# Patient Record
Sex: Male | Born: 1980 | Race: White | Hispanic: No | Marital: Married | Smoking: Current every day smoker
Health system: Southern US, Community
[De-identification: ages and names within clinical notes are randomized; demographics above are authoritative.]

## PROBLEM LIST (undated history)

## (undated) DIAGNOSIS — F419 Anxiety disorder, unspecified: Secondary | ICD-10-CM

## (undated) DIAGNOSIS — F319 Bipolar disorder, unspecified: Secondary | ICD-10-CM

## (undated) HISTORY — PX: NECK SURGERY: SHX720

---

## 2006-02-14 HISTORY — PX: EYE SURGERY: SHX253

## 2010-05-13 ENCOUNTER — Emergency Department (HOSPITAL_COMMUNITY)

## 2010-05-13 ENCOUNTER — Emergency Department (HOSPITAL_COMMUNITY)
Admission: EM | Admit: 2010-05-13 | Discharge: 2010-05-13 | Attending: Emergency Medicine | Admitting: Emergency Medicine

## 2010-05-13 DIAGNOSIS — S01501A Unspecified open wound of lip, initial encounter: Secondary | ICD-10-CM | POA: Insufficient documentation

## 2010-05-13 DIAGNOSIS — Y929 Unspecified place or not applicable: Secondary | ICD-10-CM | POA: Insufficient documentation

## 2010-05-13 DIAGNOSIS — S0003XA Contusion of scalp, initial encounter: Secondary | ICD-10-CM | POA: Insufficient documentation

## 2015-04-08 ENCOUNTER — Emergency Department (HOSPITAL_COMMUNITY)
Admission: EM | Admit: 2015-04-08 | Discharge: 2015-04-08 | Disposition: A | Discharge status: Home / Self Care | Attending: Emergency Medicine | Admitting: Emergency Medicine

## 2015-04-08 ENCOUNTER — Encounter (HOSPITAL_COMMUNITY): Payer: Self-pay | Admitting: *Deleted

## 2015-04-08 DIAGNOSIS — R197 Diarrhea, unspecified: Secondary | ICD-10-CM | POA: Insufficient documentation

## 2015-04-08 DIAGNOSIS — F17200 Nicotine dependence, unspecified, uncomplicated: Secondary | ICD-10-CM | POA: Insufficient documentation

## 2015-04-08 DIAGNOSIS — R6883 Chills (without fever): Secondary | ICD-10-CM | POA: Insufficient documentation

## 2015-04-08 DIAGNOSIS — R109 Unspecified abdominal pain: Secondary | ICD-10-CM | POA: Insufficient documentation

## 2015-04-08 DIAGNOSIS — R61 Generalized hyperhidrosis: Secondary | ICD-10-CM | POA: Insufficient documentation

## 2015-04-08 DIAGNOSIS — R1110 Vomiting, unspecified: Secondary | ICD-10-CM | POA: Insufficient documentation

## 2015-04-08 LAB — CBC
HCT: 49.6 % (ref 39.0–52.0)
Hemoglobin: 17.2 g/dL — ABNORMAL HIGH (ref 13.0–17.0)
MCH: 35.5 pg — ABNORMAL HIGH (ref 26.0–34.0)
MCHC: 34.7 g/dL (ref 30.0–36.0)
MCV: 102.5 fL — ABNORMAL HIGH (ref 78.0–100.0)
PLATELETS: 204 10*3/uL (ref 150–400)
RBC: 4.84 MIL/uL (ref 4.22–5.81)
RDW: 12.3 % (ref 11.5–15.5)
WBC: 9.7 10*3/uL (ref 4.0–10.5)

## 2015-04-08 LAB — COMPREHENSIVE METABOLIC PANEL
ALK PHOS: 68 U/L (ref 38–126)
ALT: 33 U/L (ref 17–63)
AST: 22 U/L (ref 15–41)
Albumin: 4.4 g/dL (ref 3.5–5.0)
Anion gap: 9 (ref 5–15)
BILIRUBIN TOTAL: 1.1 mg/dL (ref 0.3–1.2)
BUN: 15 mg/dL (ref 6–20)
CALCIUM: 9.6 mg/dL (ref 8.9–10.3)
CHLORIDE: 107 mmol/L (ref 101–111)
CO2: 26 mmol/L (ref 22–32)
CREATININE: 0.8 mg/dL (ref 0.61–1.24)
Glucose, Bld: 123 mg/dL — ABNORMAL HIGH (ref 65–99)
Potassium: 4.3 mmol/L (ref 3.5–5.1)
Sodium: 142 mmol/L (ref 135–145)
TOTAL PROTEIN: 8 g/dL (ref 6.5–8.1)

## 2015-04-08 LAB — LIPASE, BLOOD: LIPASE: 26 U/L (ref 11–51)

## 2015-04-08 MED ORDER — PROMETHAZINE HCL 25 MG PO TABS: 25.0000 mg | ORAL_TABLET | Freq: Four times a day (QID) | ORAL | Status: DC | PRN

## 2015-04-08 MED ORDER — ONDANSETRON HCL 4 MG/2ML IJ SOLN
4.0000 mg | Freq: Once | INTRAMUSCULAR | Status: AC | PRN
  Administered 2015-04-08: 4 mg via INTRAVENOUS
  Filled 2015-04-08: qty 2

## 2015-04-08 MED ORDER — ONDANSETRON HCL 4 MG/2ML IJ SOLN: 4.0000 mg | Freq: Once | INTRAMUSCULAR | Status: DC

## 2015-04-08 MED ORDER — SODIUM CHLORIDE 0.9 % IV BOLUS (SEPSIS)
1000.0000 mL | Freq: Once | INTRAVENOUS | Status: AC
  Administered 2015-04-08: 1000 mL via INTRAVENOUS

## 2015-04-08 NOTE — ED Notes (Signed)
Declined W/C at D/C and was escorted to lobby by RN. 

## 2015-04-08 NOTE — ED Notes (Signed)
Pt reports at 0200 vomiting x2 and has been unable to keep water down . Pt does not  Have a temp.

## 2015-04-08 NOTE — Discharge Instructions (Signed)

## 2015-04-08 NOTE — ED Provider Notes (Signed)
CSN: [id number]     Arrival date & time 04/08/15  0865 History   First MD Initiated Contact with Patient 04/08/15 [phone]     Chief Complaint  Patient presents with  . Emesis  . Nausea  . Abdominal Pain     (Consider location/radiation/quality/duration/timing/severity/associated sxs/prior Treatment) HPI Comments: The patient is a 35 year old male, he is otherwise healthy, he works as an Company secretary, he reports that over the last couple of days he has had some unusual night sweats and chills, he reports that in the last 6 hours he has developed several episodes of vomiting with multiple episodes of watery diarrhea. He has been exposed to other people at his homeless shelter where they have the flu. He denies taking any medications, he has been able to hold down only a very small amount of fluid overnight but then vomited again this morning. Symptoms are persistent, nothing makes this better or worse, his significant other in the room with him has not had similar symptoms  Patient is a 35 y.o. male presenting with vomiting and abdominal pain. The history is provided by the patient.  Emesis Associated symptoms: abdominal pain   Abdominal Pain Associated symptoms: vomiting     History reviewed. No pertinent past medical history. Past Surgical History  Procedure Laterality Date  . Eye surgery Bilateral 2008   History reviewed. No pertinent family history. Social History  Substance Use Topics  . Smoking status: Current Every Day Smoker -- 0.50 packs/day  . Smokeless tobacco: Never Used  . Alcohol Use: 0.6 oz/week    1 Cans of beer per week     Comment: social    Review of Systems  Gastrointestinal: Positive for vomiting and abdominal pain.  All other systems reviewed and are negative.     Allergies  Review of patient's allergies indicates no known allergies.  Home Medications   Prior to Admission medications   Medication Sig Start Date End Date Taking?  Authorizing Provider  promethazine (PHENERGAN) 25 MG tablet Take 1 tablet (25 mg total) by mouth every 6 (six) hours as needed for nausea or vomiting. 04/08/15   Eber Hong, MD   BP 116/85 mmHg  Pulse 85  Temp(Src) 97.6 F (36.4 C) (Oral)  Resp 18  Ht  (1.803 m)  Wt 141 lb (63.957 kg)  BMI 19.67 kg/m2  SpO2 99% Physical Exam  Constitutional: He appears well-developed and well-nourished. No distress.  HENT:  Head: Normocephalic and atraumatic.  Mouth/Throat: Oropharynx is clear and moist. No oropharyngeal exudate.  Eyes: Conjunctivae and EOM are normal. Pupils are equal, round, and reactive to light. Right eye exhibits no discharge. Left eye exhibits no discharge. No scleral icterus.  Neck: Normal range of motion. Neck supple. No JVD present. No thyromegaly present.  Cardiovascular: Normal rate, regular rhythm, normal heart sounds and intact distal pulses.  Exam reveals no gallop and no friction rub.   No murmur heard. Pulmonary/Chest: Effort normal and breath sounds normal. No respiratory distress. He has no wheezes. He has no rales.  Abdominal: Soft. Bowel sounds are normal. He exhibits no distension and no mass. There is no tenderness.  Musculoskeletal: Normal range of motion. He exhibits no edema or tenderness.  Lymphadenopathy:    He has no cervical adenopathy.  Neurological: He is alert. Coordination normal.  Skin: Skin is warm and dry. No rash noted. No erythema.  Psychiatric: He has a normal mood and affect. His behavior is normal.  Nursing note and vitals reviewed.  ED Course  Procedures (including critical care time) Labs Review Labs Reviewed  COMPREHENSIVE METABOLIC PANEL - Abnormal; Notable for the following:    Glucose, Bld 123 (*)    All other components within normal limits  CBC - Abnormal; Notable for the following:    Hemoglobin 17.2 (*)    MCV 102.5 (*)    MCH 35.5 (*)    All other components within normal limits  LIPASE, BLOOD    Imaging  Review No results found. I have personally reviewed and evaluated these images and lab results as part of my medical decision-making.    MDM   Final diagnoses:  Vomiting and diarrhea    The patient has a normal cardiac and pulmonary exam, his abdomen is soft, there is no increased bowel sounds, he has no tenderness, I suspect that he has some kind of gastrointestinal illness which could include a flulike illness, gastroenteritis, food poisoning from undercooked meat loaf which she ate last night, nonetheless the treatment plan is the same with IV fluids, antiemetics and home with medications to help him continue to fluid oral rehydrate. He is in agreement with the plan. He is stable appearing, I anticipate discharge once the splints have been given. He has expressed his understanding to the verbal discharge instructions.  Fluids given, Phenergan for home VS normal Labs reassuring  Meds given in ED:  Medications  ondansetron (ZOFRAN) injection 4 mg (not administered)  ondansetron (ZOFRAN) injection 4 mg (4 mg Intravenous Given 04/08/15 0942)  sodium chloride 0.9 % bolus 1,000 mL (1,000 mLs Intravenous New Bag/Given 04/08/15 1037)    New Prescriptions   PROMETHAZINE (PHENERGAN) 25 MG TABLET    Take 1 tablet (25 mg total) by mouth every 6 (six) hours as needed for nausea or vomiting.        Eber Hong, MD 04/08/15 1056

## 2016-06-09 ENCOUNTER — Emergency Department (HOSPITAL_COMMUNITY)
Admission: EM | Admit: 2016-06-09 | Discharge: 2016-06-10 | Disposition: A | Discharge status: Home / Self Care | Payer: Self-pay

## 2016-06-09 ENCOUNTER — Encounter (HOSPITAL_COMMUNITY): Payer: Self-pay | Admitting: Emergency Medicine

## 2016-06-09 DIAGNOSIS — F17210 Nicotine dependence, cigarettes, uncomplicated: Secondary | ICD-10-CM | POA: Insufficient documentation

## 2016-06-09 DIAGNOSIS — F1414 Cocaine abuse with cocaine-induced mood disorder: Secondary | ICD-10-CM | POA: Insufficient documentation

## 2016-06-09 DIAGNOSIS — R45851 Suicidal ideations: Secondary | ICD-10-CM

## 2016-06-09 DIAGNOSIS — F1210 Cannabis abuse, uncomplicated: Secondary | ICD-10-CM | POA: Insufficient documentation

## 2016-06-09 DIAGNOSIS — F1910 Other psychoactive substance abuse, uncomplicated: Secondary | ICD-10-CM

## 2016-06-09 HISTORY — DX: Bipolar disorder, unspecified: F31.9

## 2016-06-09 HISTORY — DX: Anxiety disorder, unspecified: F41.9

## 2016-06-09 LAB — COMPREHENSIVE METABOLIC PANEL
ALT: 84 U/L — ABNORMAL HIGH (ref 17–63)
AST: 56 U/L — ABNORMAL HIGH (ref 15–41)
Albumin: 4.3 g/dL (ref 3.5–5.0)
Alkaline Phosphatase: 62 U/L (ref 38–126)
Anion gap: 8 (ref 5–15)
BUN: 11 mg/dL (ref 6–20)
CO2: 26 mmol/L (ref 22–32)
Calcium: 9 mg/dL (ref 8.9–10.3)
Chloride: 105 mmol/L (ref 101–111)
Creatinine, Ser: 0.7 mg/dL (ref 0.61–1.24)
GFR calc Af Amer: >60 mL/min (ref >60)
GFR calc non Af Amer: >60 mL/min (ref >60)
Glucose, Bld: 94 mg/dL (ref 65–99)
Potassium: 3.7 mmol/L (ref 3.5–5.1)
Sodium: 139 mmol/L (ref 135–145)
Total Bilirubin: 0.5 mg/dL (ref 0.3–1.2)
Total Protein: 7.4 g/dL (ref 6.5–8.1)

## 2016-06-09 LAB — CBC
HCT: 41.8 % (ref 39.0–52.0)
Hemoglobin: 14.7 g/dL (ref 13.0–17.0)
MCH: 35 pg — ABNORMAL HIGH (ref 26.0–34.0)
MCHC: 35.2 g/dL (ref 30.0–36.0)
MCV: 99.5 fL (ref 78.0–100.0)
Platelets: 194 10*3/uL (ref 150–400)
RBC: 4.2 MIL/uL — ABNORMAL LOW (ref 4.22–5.81)
RDW: 12.9 % (ref 11.5–15.5)
WBC: 5.1 10*3/uL (ref 4.0–10.5)

## 2016-06-09 LAB — ACETAMINOPHEN LEVEL: Acetaminophen (Tylenol), Serum: <10 ug/mL — ABNORMAL LOW (ref 10–30)

## 2016-06-09 LAB — RAPID URINE DRUG SCREEN, HOSP PERFORMED
Amphetamines: NOT DETECTED
Barbiturates: NOT DETECTED
Benzodiazepines: NOT DETECTED
Cocaine: POSITIVE — AB
Opiates: NOT DETECTED
Tetrahydrocannabinol: POSITIVE — AB

## 2016-06-09 LAB — SALICYLATE LEVEL: Salicylate Lvl: <7 mg/dL (ref 2.8–30.0)

## 2016-06-09 LAB — ETHANOL: Alcohol, Ethyl (B): <5 mg/dL (ref <5)

## 2016-06-09 MED ORDER — IBUPROFEN 200 MG PO TABS: 600.0000 mg | ORAL_TABLET | Freq: Three times a day (TID) | ORAL | Status: DC | PRN

## 2016-06-09 MED ORDER — ONDANSETRON HCL 4 MG PO TABS: 4.0000 mg | ORAL_TABLET | Freq: Three times a day (TID) | ORAL | Status: DC | PRN
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
## 2016-06-09 NOTE — ED Triage Notes (Signed)
Pt c/o SI with plan to overdose on drugs. No HI/AVH. Using [place], cocaine, marijuana, drinking about 3 cases of beer and sometimes a fifth of vodka daily. Last drink last night. Drug habit is ruining his marriage, leading to SI.

## 2016-06-09 NOTE — ED Notes (Signed)
Patient and family became verbally aggressive towards staff. Patient's states that "Earlier on the shift they treated me like shit". "They was rude to me and when I said I was gone kill myself they walked of shrug their shoulder". Patient states that "so since they gone let me go in the morning might as well give me stuff and let me tonight so I can kill myself when I leave here". Writer explained to patient that he could not leave tonight that he would have to psychiatrist in the morning. Security was called to on unit to verbal aggression. Writer asked patient and their family member if they could step in room so that they could have a calm conversation without the yelling. Writer explained to patient and family member about the admission process and unit rules. Writer apologize for anything that may have been conceived to be disrespectful and uncaring. Patient family member is tearful and crying and states "they have treated him like shit all day and no one is concerned that he is suicidal". "I have had to stop him on three occasion from trying to kill himself". "I am afraid my mom just died two weeks ago from an over dose and I am scared it's going to happen to him". Writer entered into a therapeutic discussion with Clinical research associate and family member. Patient and their family member was apologetic for their outburst earlier. Patient calm and cooperative at this time. Encouragement and support provided and safety maintain. Snack and soda given to patient. Soda also given to patient's family member. Safety maintain and Q 15 min safety checks remain in place and video monitoring.
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
## 2016-06-09 NOTE — ED Notes (Signed)
Pt wife visiting with pt at bedside.
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
## 2016-06-09 NOTE — ED Notes (Signed)
Pt talking on hallway phone.  

## 2016-06-09 NOTE — ED Notes (Signed)
Patient reports SI with a plan to over dose on drugs. Patient denies HI and AVH at this time. Plan of care discussed. Encouragement and support provided and safety maintain. Q 15 min safety checks remain in place and video monitoring.

## 2016-06-09 NOTE — ED Notes (Signed)
Bed: [facility] Expected date:  Expected time:  Means of arrival:  Comments: 

## 2016-06-10 DIAGNOSIS — F17210 Nicotine dependence, cigarettes, uncomplicated: Secondary | ICD-10-CM

## 2016-06-10 DIAGNOSIS — F1290 Cannabis use, unspecified, uncomplicated: Secondary | ICD-10-CM

## 2016-06-10 DIAGNOSIS — F1990 Other psychoactive substance use, unspecified, uncomplicated: Secondary | ICD-10-CM

## 2016-06-10 DIAGNOSIS — F1414 Cocaine abuse with cocaine-induced mood disorder: Secondary | ICD-10-CM | POA: Diagnosis present

## 2016-06-10 NOTE — BH Assessment (Signed)
[facility] Assessment Progress Note  Per Thedore Mins, MD, this pt does not require psychiatric hospitalization at this time.  He is to be discharged from [facility].  He does not require outpatient referrals at this time.  Pt's nurse, Morrie Sheldon, has been notified.  Doylene Canning, MA Triage Specialist [phone]

## 2016-06-10 NOTE — ED Notes (Signed)
This nurse in pt room to introduce self and give  pt breakfast tray. Pt irritable and uninterested. Pt endorsing passive SI. Denies HI. Denies AVH. Encouragement and support provided. When asked if there is anything else that he needs at this time, pt reports "No, I'm alright." Informed pt that psychiatry team will be making rounds this morning, pt remains uninterested. Special checks q 15 mins in place for safety, Video monitoring in place. Will continue to monitor.
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
## 2016-06-10 NOTE — [facility] Suicide Risk Assessment (Signed)
Suicide Risk Assessment  Discharge Assessment   [facility] Discharge Suicide Risk Assessment   Principal Problem: Cocaine abuse with cocaine-induced mood disorder [facility]) Discharge Diagnoses:  Patient Active Problem List   Diagnosis Date Noted  . Cocaine abuse with cocaine-induced mood disorder [facility]) [F14.14] 06/10/2016    Priority: High    Total Time spent with patient: 45 minutes  Musculoskeletal: Strength & Muscle Tone: within normal limits Gait & Station: normal Patient leans: N/A  Psychiatric Specialty Exam: Physical Exam  Constitutional: He is oriented to person, place, and time. He appears well-developed and well-nourished.  HENT:  Head: Normocephalic.  Neck: Normal range of motion.  Respiratory: Effort normal.  Musculoskeletal: Normal range of motion.  Neurological: He is alert and oriented to person, place, and time.  Psychiatric: His speech is normal and behavior is normal. Judgment and thought content normal. His affect is angry. Cognition and memory are normal.    Review of Systems  Psychiatric/Behavioral: Positive for substance abuse.  All other systems reviewed and are negative.   Blood pressure 127/75, pulse 64, temperature 97.5 F (36.4 C), temperature source Oral, resp. rate 17, height  (1.803 m), weight 63.5 kg (140 lb), SpO2 99 %.Body mass index is 19.53 kg/m.  General Appearance: Casual  Eye Contact:  Good  Speech:  Normal Rate  Volume:  Normal  Mood:  Irritable  Affect:  Congruent  Thought Process:  Coherent and Descriptions of Associations: Intact  Orientation:  Full (Time, Place, and Person)  Thought Content:  WDL and Logical  Suicidal Thoughts:  No  Homicidal Thoughts:  No  Memory:  Immediate;   Good Recent;   Good Remote;   Good  Judgement:  Fair  Insight:  Fair  Psychomotor Activity:  Normal  Concentration:  Concentration: Good and Attention Span: Good  Recall:  Good  Fund of Knowledge:  Fair  Language:  Good  Akathisia:  No  Handed:   Right  AIMS (if indicated):     Assets:  Leisure Time Physical Health Resilience Social Support  ADL's:  Intact  Cognition:  WNL  Sleep:       Mental Status Per Nursing Assessment::   On Admission:   polysubstance abuse with suicidal ideations  Demographic Factors:  Male and Caucasian  Loss Factors: Legal issues  Historical Factors: NA  Risk Reduction Factors:   Sense of responsibility to family, Living with another person, especially a relative and Positive social support  Continued Clinical Symptoms:  Irritable  Cognitive Features That Contribute To Risk:  None    Suicide Risk:  Minimal: No identifiable suicidal ideation.  Patients presenting with no risk factors but with morbid ruminations; may be classified as minimal risk based on the severity of the depressive symptoms    Plan Of Care/Follow-up recommendations:  Activity:  as tolerated Diet:  heart healhty diet  Chrishonda Hesch, NP 06/10/2016, 10:25 AM
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
## 2016-06-10 NOTE — ED Notes (Signed)
Pt d/c home per MD order. Discharge summary reviewed with pt. Pt uninterested. Pt reports that he will be going outside to smoke a cigaerette and coming back to the ED. Jameson,NP made aware. Pt refuse to allow nurse tech take d/c vs.

## 2016-06-10 NOTE — ED Notes (Signed)
Pt demanding to leave, reports he would rather be in jail then in here. Jorene Minors, NP made aware.

## 2016-06-13 ENCOUNTER — Encounter (HOSPITAL_COMMUNITY): Payer: Self-pay | Admitting: *Deleted

## 2016-07-21 ENCOUNTER — Encounter (HOSPITAL_COMMUNITY): Payer: Self-pay | Admitting: Vascular Surgery

## 2016-07-21 ENCOUNTER — Emergency Department (HOSPITAL_COMMUNITY)
Admission: EM | Admit: 2016-07-21 | Discharge: 2016-07-21 | Disposition: A | Discharge status: Other Acute Inpatient Hospital | Attending: Emergency Medicine | Admitting: Emergency Medicine

## 2016-07-21 ENCOUNTER — Inpatient Hospital Stay (HOSPITAL_COMMUNITY)
Admission: AD | Admit: 2016-07-21 | Discharge: 2016-07-29 | DRG: 885 | Disposition: A | Discharge status: Home / Self Care | Payer: Federal, State, Local not specified - Other | Source: Intra-hospital | Attending: Psychiatry | Admitting: Psychiatry

## 2016-07-21 DIAGNOSIS — G4700 Insomnia, unspecified: Secondary | ICD-10-CM | POA: Diagnosis present

## 2016-07-21 DIAGNOSIS — F419 Anxiety disorder, unspecified: Secondary | ICD-10-CM | POA: Insufficient documentation

## 2016-07-21 DIAGNOSIS — Z630 Problems in relationship with spouse or partner: Secondary | ICD-10-CM | POA: Diagnosis not present

## 2016-07-21 DIAGNOSIS — R45851 Suicidal ideations: Secondary | ICD-10-CM | POA: Diagnosis present

## 2016-07-21 DIAGNOSIS — Z653 Problems related to other legal circumstances: Secondary | ICD-10-CM | POA: Diagnosis not present

## 2016-07-21 DIAGNOSIS — F1190 Opioid use, unspecified, uncomplicated: Secondary | ICD-10-CM | POA: Diagnosis not present

## 2016-07-21 DIAGNOSIS — Z915 Personal history of self-harm: Secondary | ICD-10-CM | POA: Diagnosis not present

## 2016-07-21 DIAGNOSIS — F39 Unspecified mood [affective] disorder: Secondary | ICD-10-CM | POA: Diagnosis not present

## 2016-07-21 DIAGNOSIS — R740 Nonspecific elevation of levels of transaminase and lactic acid dehydrogenase [LDH]: Secondary | ICD-10-CM | POA: Diagnosis present

## 2016-07-21 DIAGNOSIS — F1990 Other psychoactive substance use, unspecified, uncomplicated: Secondary | ICD-10-CM | POA: Diagnosis not present

## 2016-07-21 DIAGNOSIS — F1414 Cocaine abuse with cocaine-induced mood disorder: Secondary | ICD-10-CM | POA: Insufficient documentation

## 2016-07-21 DIAGNOSIS — Z590 Homelessness: Secondary | ICD-10-CM

## 2016-07-21 DIAGNOSIS — F411 Generalized anxiety disorder: Secondary | ICD-10-CM | POA: Diagnosis present

## 2016-07-21 DIAGNOSIS — F17210 Nicotine dependence, cigarettes, uncomplicated: Secondary | ICD-10-CM | POA: Diagnosis present

## 2016-07-21 DIAGNOSIS — F1290 Cannabis use, unspecified, uncomplicated: Secondary | ICD-10-CM | POA: Diagnosis not present

## 2016-07-21 DIAGNOSIS — F319 Bipolar disorder, unspecified: Principal | ICD-10-CM | POA: Diagnosis present

## 2016-07-21 DIAGNOSIS — Z560 Unemployment, unspecified: Secondary | ICD-10-CM

## 2016-07-21 DIAGNOSIS — Z62810 Personal history of physical and sexual abuse in childhood: Secondary | ICD-10-CM | POA: Diagnosis present

## 2016-07-21 DIAGNOSIS — F1490 Cocaine use, unspecified, uncomplicated: Secondary | ICD-10-CM | POA: Diagnosis not present

## 2016-07-21 LAB — COMPREHENSIVE METABOLIC PANEL
ALBUMIN: 4.2 g/dL (ref 3.5–5.0)
ALK PHOS: 59 U/L (ref 38–126)
ALT: 102 U/L — ABNORMAL HIGH (ref 17–63)
ANION GAP: 9 (ref 5–15)
AST: 55 U/L — ABNORMAL HIGH (ref 15–41)
BILIRUBIN TOTAL: 1.1 mg/dL (ref 0.3–1.2)
BUN: 6 mg/dL (ref 6–20)
CALCIUM: 9.3 mg/dL (ref 8.9–10.3)
CO2: 25 mmol/L (ref 22–32)
Chloride: 105 mmol/L (ref 101–111)
Creatinine, Ser: 0.86 mg/dL (ref 0.61–1.24)
GFR calc non Af Amer: >60 mL/min (ref >60)
GLUCOSE: 121 mg/dL — AB (ref 65–99)
POTASSIUM: 3.9 mmol/L (ref 3.5–5.1)
SODIUM: 139 mmol/L (ref 135–145)
TOTAL PROTEIN: 7.4 g/dL (ref 6.5–8.1)

## 2016-07-21 LAB — RAPID URINE DRUG SCREEN, HOSP PERFORMED
Amphetamines: NOT DETECTED
BENZODIAZEPINES: NOT DETECTED
Barbiturates: NOT DETECTED
COCAINE: POSITIVE — AB
OPIATES: NOT DETECTED
Tetrahydrocannabinol: POSITIVE — AB

## 2016-07-21 LAB — ETHANOL: Alcohol, Ethyl (B): <5 mg/dL (ref <5)

## 2016-07-21 LAB — CBC
HCT: 44.7 % (ref 39.0–52.0)
Hemoglobin: 15.4 g/dL (ref 13.0–17.0)
MCH: 34.8 pg — ABNORMAL HIGH (ref 26.0–34.0)
MCHC: 34.5 g/dL (ref 30.0–36.0)
MCV: 100.9 fL — ABNORMAL HIGH (ref 78.0–100.0)
Platelets: 245 10*3/uL (ref 150–400)
RBC: 4.43 MIL/uL (ref 4.22–5.81)
RDW: 12.6 % (ref 11.5–15.5)
WBC: 4.5 10*3/uL (ref 4.0–10.5)

## 2016-07-21 LAB — ACETAMINOPHEN LEVEL

## 2016-07-21 LAB — SALICYLATE LEVEL

## 2016-07-21 MED ORDER — ALUM & MAG HYDROXIDE-SIMETH 200-200-20 MG/5ML PO SUSP
30.0000 mL | ORAL | Status: DC | PRN
  Administered 2016-07-25: 30 mL via ORAL
  Filled 2016-07-21: qty 30

## 2016-07-21 MED ORDER — NICOTINE 21 MG/24HR TD PT24
21.0000 mg | MEDICATED_PATCH | Freq: Every day | TRANSDERMAL | Status: DC
  Administered 2016-07-22: 21 mg via TRANSDERMAL
  Filled 2016-07-21 (×3): qty 1

## 2016-07-21 MED ORDER — LORAZEPAM 1 MG PO TABS
2.0000 mg | ORAL_TABLET | Freq: Four times a day (QID) | ORAL | Status: DC | PRN
  Administered 2016-07-22 – 2016-07-26 (×11): 2 mg via ORAL
  Filled 2016-07-21 (×11): qty 2

## 2016-07-21 MED ORDER — ONDANSETRON HCL 4 MG PO TABS: 4.0000 mg | ORAL_TABLET | Freq: Three times a day (TID) | ORAL | Status: DC | PRN

## 2016-07-21 MED ORDER — TRAZODONE HCL 50 MG PO TABS
50.0000 mg | ORAL_TABLET | Freq: Every evening | ORAL | Status: DC | PRN
  Administered 2016-07-22: 50 mg via ORAL
  Filled 2016-07-21: qty 1

## 2016-07-21 MED ORDER — LORAZEPAM 1 MG PO TABS
0.0000 mg | ORAL_TABLET | Freq: Four times a day (QID) | ORAL | Status: DC
Start: 2016-07-21 — End: 2016-07-21

## 2016-07-21 MED ORDER — THIAMINE HCL 100 MG/ML IJ SOLN: 100.0000 mg | Freq: Every day | INTRAMUSCULAR | Status: DC

## 2016-07-21 MED ORDER — LORAZEPAM 2 MG/ML IJ SOLN: 0.0000 mg | Freq: Four times a day (QID) | INTRAMUSCULAR | Status: DC

## 2016-07-21 MED ORDER — NICOTINE 21 MG/24HR TD PT24
21.0000 mg | MEDICATED_PATCH | Freq: Every day | TRANSDERMAL | Status: DC
  Administered 2016-07-21: 21 mg via TRANSDERMAL
  Filled 2016-07-21: qty 1

## 2016-07-21 MED ORDER — ACETAMINOPHEN 325 MG PO TABS
650.0000 mg | ORAL_TABLET | Freq: Four times a day (QID) | ORAL | Status: DC | PRN
  Administered 2016-07-23: 650 mg via ORAL
  Filled 2016-07-21: qty 2

## 2016-07-21 MED ORDER — HYDROXYZINE HCL 25 MG PO TABS
25.0000 mg | ORAL_TABLET | Freq: Four times a day (QID) | ORAL | Status: DC | PRN
  Administered 2016-07-23 – 2016-07-24 (×2): 25 mg via ORAL
  Filled 2016-07-21 (×2): qty 1

## 2016-07-21 MED ORDER — VITAMIN B-1 100 MG PO TABS
100.0000 mg | ORAL_TABLET | Freq: Every day | ORAL | Status: DC
  Administered 2016-07-21: 100 mg via ORAL
  Filled 2016-07-21: qty 1

## 2016-07-21 MED ORDER — IBUPROFEN 400 MG PO TABS: 600.0000 mg | ORAL_TABLET | Freq: Three times a day (TID) | ORAL | Status: DC | PRN

## 2016-07-21 MED ORDER — LORAZEPAM 2 MG/ML IJ SOLN: 0.0000 mg | Freq: Two times a day (BID) | INTRAMUSCULAR | Status: DC

## 2016-07-21 MED ORDER — ALUM & MAG HYDROXIDE-SIMETH 200-200-20 MG/5ML PO SUSP: 30.0000 mL | Freq: Four times a day (QID) | ORAL | Status: DC | PRN

## 2016-07-21 MED ORDER — ACETAMINOPHEN 325 MG PO TABS: 650.0000 mg | ORAL_TABLET | ORAL | Status: DC | PRN

## 2016-07-21 MED ORDER — MAGNESIUM HYDROXIDE 400 MG/5ML PO SUSP: 30.0000 mL | Freq: Every day | ORAL | Status: DC | PRN

## 2016-07-21 MED ORDER — LORAZEPAM 1 MG PO TABS: 0.0000 mg | ORAL_TABLET | Freq: Two times a day (BID) | ORAL | Status: DC

## 2016-07-21 NOTE — ED Notes (Signed)
Belongings were inventoried, placing in locker #6, valuables locked with security. Pt verbalized understanding of rules and copy is at bedside for pt.

## 2016-07-21 NOTE — ED Notes (Signed)
Pt placing 1st phone call

## 2016-07-21 NOTE — ED Triage Notes (Signed)
Pt reports to the ED for eval of SI. States it has been ongoing x several months but has increased in the past 2 days. He has a plan to OD on rx drugs and he has access to his gf's drugs. Denies any recent substance abuse. Denies any auditory hallucinations but reports some visual hallucinations.

## 2016-07-21 NOTE — ED Notes (Signed)
Pt placing 2nd phone call

## 2016-07-21 NOTE — ED Notes (Signed)
Sitter at bedside.

## 2016-07-21 NOTE — ED Notes (Signed)
Gave pt Sprite, graham crackers and peanut butter, per Delorise Jacksonori - RN.

## 2016-07-21 NOTE — ED Notes (Signed)
Meal given to pt, TTS at bedside.
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
## 2016-07-22 ENCOUNTER — Encounter (HOSPITAL_COMMUNITY): Payer: Self-pay | Admitting: *Deleted

## 2016-07-22 DIAGNOSIS — Z818 Family history of other mental and behavioral disorders: Secondary | ICD-10-CM

## 2016-07-22 DIAGNOSIS — Z590 Homelessness: Secondary | ICD-10-CM

## 2016-07-22 DIAGNOSIS — F17210 Nicotine dependence, cigarettes, uncomplicated: Secondary | ICD-10-CM

## 2016-07-22 DIAGNOSIS — Z560 Unemployment, unspecified: Secondary | ICD-10-CM

## 2016-07-22 DIAGNOSIS — F319 Bipolar disorder, unspecified: Principal | ICD-10-CM

## 2016-07-22 MED ORDER — GABAPENTIN 600 MG PO TABS
300.0000 mg | ORAL_TABLET | Freq: Three times a day (TID) | ORAL | Status: DC
  Filled 2016-07-22 (×3): qty 0.5

## 2016-07-22 MED ORDER — GABAPENTIN 300 MG PO CAPS
300.0000 mg | ORAL_CAPSULE | Freq: Three times a day (TID) | ORAL | Status: DC
  Administered 2016-07-22 – 2016-07-29 (×20): 300 mg via ORAL
  Filled 2016-07-22: qty 1
  Filled 2016-07-22: qty 21
  Filled 2016-07-22 (×3): qty 1
  Filled 2016-07-22: qty 21
  Filled 2016-07-22 (×10): qty 1
  Filled 2016-07-22: qty 21
  Filled 2016-07-22 (×11): qty 1

## 2016-07-22 NOTE — Progress Notes (Signed)
    [facility]. Patient is totally asymptomatic. ECG reviewed by Dr. Peter SwazilandJordan who felt it was early repol with bradycardia. NOT acute STEMI. No further work up indicated. Cardiology does not need to consult on this patient.   Cline CrockKathryn Caleel Kiner PA-C  [facility]
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
## 2016-07-22 NOTE — Progress Notes (Addendum)
Received order for EKG- patient's pulse bradycardic, but asymptomatic (no chest pain, shortness of breath, dizziness, syncope).  EKG machine read acute STEMI.  MD notified, contacted cardiology to interpret EKG.  EKG faxed to Cardiology due to EKG machine not connecting to network.  Cardiology reviewed EKG and suggested no further workup (see note by Cline CrockKathryn Thompson PA).  Patient and MD notified.

## 2016-07-22 NOTE — Progress Notes (Addendum)
Patient ID: Cody Gallagher, male   DOB: 01/26/1981, 36 y.o.   MRN: [id number]  Pt currently presents with an anxious affect and assertive behavior. Pt reports concerns about having "too many medications" when he is discharged. Pt reports anxiety that he will "take too many of them like I did last time." Pt states "I don't know that I feel the Neurontin working, I mean I have taken up to 3100 mg at once before."Pt states that his wife takes multiple medications and abuses them often.  Pt reports poor sleep with current medication regimen, intermittently wakes up throughout the night. Endorsing withdrawal symptoms including sweating and sleep disturbance.  Pt provided with medications per providers orders. Pt's labs and vitals were monitored throughout the night. Pt given a 1:1 about emotional and mental status. Pt supported and encouraged to express concerns and questions. Pt educated on medications and the difference between medication use and abuse.   Pt's safety ensured with 15 minute and environmental checks. Pt currently denies SI ("right now"), HI and A/V hallucinations. Pt verbally agrees to seek staff if SI, HI or A/VH occurs and to consult with staff before acting on any harmful thoughts. Will continue POC.

## 2016-07-22 NOTE — Progress Notes (Signed)
Recreation Therapy Notes  Date: 07/22/16 Time: 0930 Location: [address] Dayroom  Group Topic: Stress Management  Goal Area(s) Addresses:  Patient will verbalize importance of using healthy stress management.  Patient will identify positive emotions associated with healthy stress management.   Intervention: Stress Management  Activity :  Guided Imagery.  LRT introduced the stress management technique of guided imagery.  LRT read a script to allow patients to take a mental vacation to escape their current surroundings.  Patients were to follow along as the script was read to fully engage in the activity.  Education:  Stress Management, Discharge Planning.   Education Outcome: Acknowledges edcuation/In group clarification offered/Needs additional education  Clinical Observations/Feedback: Pt did not attend group.   Caroll RancherMarjette Ezio Wieck, LRT/CTRS         Lillia AbedLindsay, Pax Reasoner A 07/22/2016 11:19 AM
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
## 2016-07-22 NOTE — [facility] Group Notes (Signed)
[facility] LCSW Group Therapy  07/22/2016 4:04 PM  Type of Therapy:  Group Therapy  Participation Level:  Did Not Attend-pt meeting with CSW to complete assessment during group time. EXCUSED.   Summary of Progress/Problems: Feelings around Relapse. Group members discussed the meaning of relapse and shared personal stories of relapse, how it affected them and others, and how they perceived themselves during this time. Group members were encouraged to identify triggers, warning signs and coping skills used when facing the possibility of relapse. Social supports were discussed and explored in detail. Post Acute Withdrawal Syndrome (handout provided) was introduced and examined. Pt's were encouraged to ask questions, talk about key points associated with PAWS, and process this information in terms of relapse prevention.   Jhoanna Heyde N Smart LCSW 07/22/2016, 4:04 PM
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
## 2016-07-22 NOTE — Tx Team (Signed)
Interdisciplinary Treatment and Diagnostic Plan Update  07/22/2016 Time of Session: 0930 Cody Gallagher MRN: [id number]  Principal Diagnosis: Alcohol Use Disorder  Secondary Diagnoses: Active Problems:   Bipolar 1 disorder (HCC)   Current Medications:  Current Facility-Administered Medications  Medication Dose Route Frequency Provider Last Rate Last Dose  . acetaminophen (TYLENOL) tablet 650 mg  650 mg Oral Q6H PRN Burnard Leigh, MD      . alum & mag hydroxide-simeth (MAALOX/MYLANTA) 200-200-20 MG/5ML suspension 30 mL  30 mL Oral Q4H PRN Burnard Leigh, MD      . gabapentin (NEURONTIN) capsule 300 mg  300 mg Oral TID Cobos, Rockey Situ, MD   300 mg at 07/22/16 1257  . hydrOXYzine (ATARAX/VISTARIL) tablet 25 mg  25 mg Oral Q6H PRN Burnard Leigh, MD      . LORazepam (ATIVAN) tablet 2 mg  2 mg Oral Q6H PRN Burnard Leigh, MD      . magnesium hydroxide (MILK OF MAGNESIA) suspension 30 mL  30 mL Oral Daily PRN Burnard Leigh, MD      . nicotine (NICODERM CQ - dosed in mg/24 hours) patch 21 mg  21 mg Transdermal Q0600 Burnard Leigh, MD      . traZODone (DESYREL) tablet 50 mg  50 mg Oral QHS PRN,MR X 1 Eksir, Bo Mcclintock, MD       PTA Medications: Prescriptions Prior to Admission  Medication Sig Dispense Refill Last Dose  . promethazine (PHENERGAN) 25 MG tablet Take 1 tablet (25 mg total) by mouth every 6 (six) hours as needed for nausea or vomiting. (Patient not taking: Reported on 07/21/2016) 12 tablet 0 Completed Course at Unknown time    Patient Stressors: Legal issue Marital or family conflict Substance abuse  Patient Strengths: Ability for insight Average or above average intelligence Motivation for treatment/growth Religious Affiliation Supportive family/friends  Treatment Modalities: Medication Management, Group therapy, Case management,  1 to 1 session with clinician, Psychoeducation, Recreational therapy.   Physician Treatment  Plan for Primary Diagnosis: Alcohol Use Disorder Long Term Goal(s): Improvement in symptoms so as ready for discharge Improvement in symptoms so as ready for discharge   Short Term Goals: Ability to identify changes in lifestyle to reduce recurrence of condition will improve Ability to verbalize feelings will improve Ability to disclose and discuss suicidal ideas Ability to demonstrate self-control will improve Ability to identify and develop effective coping behaviors will improve Ability to maintain clinical measurements within normal limits will improve Compliance with prescribed medications will improve Ability to identify triggers associated with substance abuse/mental health issues will improve Ability to identify changes in lifestyle to reduce recurrence of condition will improve Ability to verbalize feelings will improve Ability to disclose and discuss suicidal ideas Ability to demonstrate self-control will improve Ability to identify and develop effective coping behaviors will improve Ability to maintain clinical measurements within normal limits will improve Compliance with prescribed medications will improve Ability to identify triggers associated with substance abuse/mental health issues will improve  Medication Management: Evaluate patient's response, side effects, and tolerance of medication regimen.  Therapeutic Interventions: 1 to 1 sessions, Unit Group sessions and Medication administration.  Evaluation of Outcomes: Progressing  Physician Treatment Plan for Secondary Diagnosis: Active Problems:   Bipolar 1 disorder (HCC)  Long Term Goal(s): Improvement in symptoms so as ready for discharge Improvement in symptoms so as ready for discharge   Short Term Goals: Ability to identify changes in lifestyle to reduce recurrence of condition will improve  Ability to verbalize feelings will improve Ability to disclose and discuss suicidal ideas Ability to demonstrate  self-control will improve Ability to identify and develop effective coping behaviors will improve Ability to maintain clinical measurements within normal limits will improve Compliance with prescribed medications will improve Ability to identify triggers associated with substance abuse/mental health issues will improve Ability to identify changes in lifestyle to reduce recurrence of condition will improve Ability to verbalize feelings will improve Ability to disclose and discuss suicidal ideas Ability to demonstrate self-control will improve Ability to identify and develop effective coping behaviors will improve Ability to maintain clinical measurements within normal limits will improve Compliance with prescribed medications will improve Ability to identify triggers associated with substance abuse/mental health issues will improve     Medication Management: Evaluate patient's response, side effects, and tolerance of medication regimen.  Therapeutic Interventions: 1 to 1 sessions, Unit Group sessions and Medication administration.  Evaluation of Outcomes: Progressing   RN Treatment Plan for Primary Diagnosis: Alcohol Use Disorder Long Term Goal(s): Knowledge of disease and therapeutic regimen to maintain health will improve  Short Term Goals: Ability to remain free from injury will improve, Ability to disclose and discuss suicidal ideas and Ability to identify and develop effective coping behaviors will improve  Medication Management: RN will administer medications as ordered by provider, will assess and evaluate patient's response and provide education to patient for prescribed medication. RN will report any adverse and/or side effects to prescribing provider.  Therapeutic Interventions: 1 on 1 counseling sessions, Psychoeducation, Medication administration, Evaluate responses to treatment, Monitor vital signs and CBGs as ordered, Perform/monitor CIWA, COWS, AIMS and Fall Risk screenings  as ordered, Perform wound care treatments as ordered.  Evaluation of Outcomes: Progressing   LCSW Treatment Plan for Primary Diagnosis: Alcohol Use Disorder Long Term Goal(s): Safe transition to appropriate next level of care at discharge, Engage patient in therapeutic group addressing interpersonal concerns.  Short Term Goals: Engage patient in aftercare planning with referrals and resources, Facilitate patient progression through stages of change regarding substance use diagnoses and concerns and Identify triggers associated with mental health/substance abuse issues  Therapeutic Interventions: Assess for all discharge needs, 1 to 1 time with Social worker, Explore available resources and support systems, Assess for adequacy in community support network, Educate family and significant other(s) on suicide prevention, Complete Psychosocial Assessment, Interpersonal group therapy.  Evaluation of Outcomes: Progressing   Progress in Treatment: Attending groups: No. New to unit. Continuing to assess.  Participating in groups: No. Taking medication as prescribed: Yes. Toleration medication: Yes. Family/Significant other contact made: No, will contact:  family member if patient consents. Patient understands diagnosis: Yes. Discussing patient identified problems/goals with staff: No. Medical problems stabilized or resolved: No. Denies suicidal/homicidal ideation: No, pt currently endorsing passive SI/Able to contract for safety on the unit.  Issues/concerns per patient self-inventory: No. Other: n/a   New problem(s) identified: No, Describe:  n/a  New Short Term/Long Term Goal(s): elimination of SI thoughts; medication stabilization; detox, development of comprehensive mental wellness/sobriety plan.   Discharge Plan or Barriers: CSW assessing for appropriate referrals. [facility] but left after 30 hours and may not be eligible to return for 90 days. [facility] is  possibly if pt is still interested in inpatient treatment. Pt identifies as homeless currently and has no current outpatient mental health providers.   Reason for Continuation of Hospitalization: Anxiety Depression Medication stabilization Suicidal ideation Withdrawal symptoms  Estimated Length of Stay: 3-5 days  Attendees: Patient: 07/22/2016 1:34 PM  Physician: Dr. Jackquline Berlin MD 07/22/2016 1:34 PM  Nursing: Elpidio Galea RN 07/22/2016 1:34 PM  RN Care Manager: Onnie Boer CM 07/22/2016 1:34 PM  Social Worker: Chartered loss adjuster, LCSW 07/22/2016 1:34 PM  Recreational Therapist: x 07/22/2016 1:34 PM  Other: Armandina Stammer NP 07/22/2016 1:34 PM  Other:  07/22/2016 1:34 PM  Other: 07/22/2016 1:34 PM    Scribe for Treatment Team: Ledell Peoples Smart, LCSW 07/22/2016 1:34 PM

## 2016-07-22 NOTE — Tx Team (Addendum)
Initial Treatment Plan 07/22/2016 12:42 AM Renold GentaAnthony Mcgruder [id number]    PATIENT STRESSORS: Legal issue Marital or family conflict Substance abuse   PATIENT STRENGTHS: Ability for insight Average or above average intelligence Motivation for treatment/growth Religious Affiliation Supportive family/friends   PATIENT IDENTIFIED PROBLEMS: At risk for suicide  Substance Abuse  "To get a disability check"  "Stay off of drugs"               DISCHARGE CRITERIA:  Adequate post-discharge living arrangements Improved stabilization in mood, thinking, and/or behavior Motivation to continue treatment in a less acute level of care Reduction of life-threatening or endangering symptoms to within safe limits Withdrawal symptoms are absent or subacute and managed without 24-hour nursing intervention  PRELIMINARY DISCHARGE PLAN: Attend 12-step recovery group Outpatient therapy  PATIENT/FAMILY INVOLVEMENT: This treatment plan has been presented to and reviewed with the patient, Renold Gentanthony Helvey.  The patient and family have been given the opportunity to ask questions and make suggestions.  Carleene OverlieMiddleton, Jeovani Weisenburger P, RN 07/22/2016, 12:42 AM

## 2016-07-22 NOTE — Progress Notes (Signed)
Patient attended AA group meeting.  

## 2016-07-22 NOTE — Progress Notes (Signed)
Nursing Note 07/22/2016 [id number]  Data Reports sleeping poorly, laying in bed this AM with eyes closed, stated he was sleeping in and will get up for lunch.  States "I am detoxing. I'm not shaking or sweating anymore." Affect depressed.  See other note regarding abnormal EKG.  Attending groups, appropriate with peers/staff.  Denies HI, SI, AVH.  Action Spoke with patient 1:1, nurse offered support to patient throughout shift.  Continues to be monitored on 15 minute checks for safety.  Response Remains safe on unit.

## 2016-07-23 MED ORDER — NICOTINE 21 MG/24HR TD PT24
21.0000 mg | MEDICATED_PATCH | Freq: Every day | TRANSDERMAL | Status: DC
  Administered 2016-07-23 – 2016-07-29 (×7): 21 mg via TRANSDERMAL
  Filled 2016-07-23 (×9): qty 1

## 2016-07-23 NOTE — Progress Notes (Signed)
D: Pt denies SI/HI/AVH. Pt is pleasant and cooperative. Pt stated he was having issues with withdrawal Sx, pt said the Vistaril and ativan helped him take the edge off. Pt concerned about life decisions he's going to make when he has to get out.   A: Pt was offered support and encouragement. Pt was given scheduled medications. Pt was encourage to attend groups. Q 15 minute checks were done for safety.   R:Pt attends groups and interacts well with peers and staff. Pt is taking medication. Pt receptive to treatment and safety maintained on unit.

## 2016-07-23 NOTE — [facility] Group Notes (Signed)
[facility] Group Notes: (Clinical Social Work)   07/23/2016      Type of Therapy:  Group Therapy   Participation Level:  Did Not Attend despite MHT prompting   Ambrose MantleMareida Grossman-Orr, LCSW 07/23/2016, 5:03 PM

## 2016-07-23 NOTE — Plan of Care (Signed)
Problem: Education: Goal: Verbalization of understanding the information provided will improve Outcome: Progressing Patient verbalizes understanding of information, education provided however would benefit from re-education.  Problem: Safety: Goal: Periods of time without injury will increase Outcome: Progressing Patient has not engaged in self harm, denies SI.

## 2016-07-23 NOTE — [facility] Group Notes (Signed)
[facility] Group Notes:  (Nursing/MHT/Case Management/Adjunct)  Date:  07/23/2016  Time:  1315  Type of Therapy:  Nurse Education  Participation Level:  Did Not Attend  Participation Quality:    Affect:    Cognitive:    Insight:    Engagement in Group:    Modes of Intervention:    Summary of Progress/Problems: Patient invited however elected to remain in bed.   Dawon Troop Eakes 07/23/2016, 2:38 PM 
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
## 2016-07-23 NOTE — Progress Notes (Signed)
D: Spoke with patient 1:1 who has complained of somnolence most of the day. "I'm never taking that trazadone today. I am so hungover." Patient does however request prn ativan due to agitation, anxiety and internal restlessness. Patient's affect flat, mood depressed. Rating depression at an 8/10, hopelessness at a 3/10 and anxiety at a 0/10. Rates sleep as good, appetite as good, energy as hyper and concentration as good.  States goal for today is to "meditate" and work on "my anger." Denies pain.   A: Medicated per orders, prn ativan given. Emotional support offered and self inventory reviewed. Encouraged completion of Suicide Safety Plan. Discussed POC with MD, SW.  R: Patient verbalizes understanding of POC. On reassess, patient reports decreased anxiety and agitation. Patient denies SI/HI and remains safe on level III obs.

## 2016-07-23 NOTE — Plan of Care (Signed)
Problem: Safety: Goal: Periods of time without injury will increase Outcome: Progressing Pt safe on the unit at this time   

## 2016-07-24 DIAGNOSIS — G4700 Insomnia, unspecified: Secondary | ICD-10-CM

## 2016-07-24 DIAGNOSIS — F1994 Other psychoactive substance use, unspecified with psychoactive substance-induced mood disorder: Secondary | ICD-10-CM

## 2016-07-24 DIAGNOSIS — F1490 Cocaine use, unspecified, uncomplicated: Secondary | ICD-10-CM

## 2016-07-24 DIAGNOSIS — M545 Low back pain: Secondary | ICD-10-CM

## 2016-07-24 DIAGNOSIS — F1290 Cannabis use, unspecified, uncomplicated: Secondary | ICD-10-CM

## 2016-07-24 MED ORDER — SERTRALINE HCL 25 MG PO TABS
25.0000 mg | ORAL_TABLET | Freq: Every day | ORAL | Status: DC
  Administered 2016-07-24 – 2016-07-26 (×3): 25 mg via ORAL
  Filled 2016-07-24 (×5): qty 1

## 2016-07-24 MED ORDER — IBUPROFEN 600 MG PO TABS
600.0000 mg | ORAL_TABLET | Freq: Four times a day (QID) | ORAL | Status: DC | PRN
  Administered 2016-07-24: 600 mg via ORAL
  Filled 2016-07-24: qty 1

## 2016-07-24 MED ORDER — LIDOCAINE 5 % EX PTCH
1.0000 | MEDICATED_PATCH | CUTANEOUS | Status: DC
  Administered 2016-07-24: 1 via TRANSDERMAL
  Filled 2016-07-24 (×3): qty 1

## 2016-07-24 MED ORDER — ARIPIPRAZOLE 5 MG PO TABS
5.0000 mg | ORAL_TABLET | Freq: Every day | ORAL | Status: DC
  Administered 2016-07-24 – 2016-07-26 (×3): 5 mg via ORAL
  Filled 2016-07-24 (×5): qty 1

## 2016-07-24 MED ORDER — MIRTAZAPINE 15 MG PO TABS
7.5000 mg | ORAL_TABLET | Freq: Once | ORAL | Status: AC
  Administered 2016-07-24: 7.5 mg via ORAL
  Filled 2016-07-24: qty 0.5
  Filled 2016-07-24: qty 1
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
## 2016-07-24 NOTE — Progress Notes (Signed)
Patient did attend the evening speaker AA meeting.  

## 2016-07-24 NOTE — Progress Notes (Signed)
Patient did not attend group.

## 2016-07-24 NOTE — Progress Notes (Signed)
DAR NOTE: Patient presents with anxious affect and depressed mood. Pt complained of being scared and had a crying spell. Complained of back pain, denies auditory and visual hallucinations but endorses passive SI and verbally contracted for safety.  Rates depression at 8, hopelessness at 5, and anxiety at 9.  Maintained on routine safety checks.  Medications given as prescribed.  Support and encouragement offered as needed.  States goal for today is " myself"  Patient observed socializing with peers in the dayroom, will continue to monitor.

## 2016-07-24 NOTE — [facility] Group Notes (Signed)
[facility] Cody Gallagher Group Therapy  07/24/2016 11 AM  Type of Therapy:  Group Therapy  Participation Level:  Minimal  Participation Quality:  Inattentive  Affect:  Flat  Cognitive:  Alert  Insight:  None shared  Engagement in Therapy:  None noted  Modes of Intervention:  Discussion, Exploration, Problem-solving, Socialization and Support  Summary of Progress/Problems: Topic for today was thoughts and feelings regarding discharge. We discussed fears of upcoming changes including judgements, expectations and stigma of mental health issues. We then discussed supports: what constitutes a supportive framework, identification of supports and what to do when others are not supportive. Patients then processed the concept of self care and what that might look like. Patient was inattentive and quiet following the group warm up.   Cody Bernatherine C Alyssamarie Mounsey, Cody Gallagher

## 2016-07-24 NOTE — [facility] Group Notes (Signed)
[facility] Group Notes:  (Nursing/MHT/Case Management/Adjunct)  Date:  07/24/2016  Time:  4:39 PM Type of Therapy:  Psychoeducational Skills  Participation Level:  Did Not Attend  Participation Quality:  DID NOT ATTEND  Affect:  DID NOT ATTEND  Cognitive:  DID NOT ATTEND  Insight:  None  Engagement in Group:  DID NOT ATTEND  Modes of Intervention:  DID NOT ATTEND  Summary of Progress/Problems: Pt did not attend patient self inventory group.   Cody PunchesJane O Jhostin Gallagher 07/24/2016, 4:39 PM

## 2016-07-25 MED ORDER — LIDOCAINE 5 % EX PTCH
1.0000 | MEDICATED_PATCH | CUTANEOUS | Status: DC
  Administered 2016-07-25: 1 via TRANSDERMAL
  Filled 2016-07-25 (×2): qty 1
  Filled 2016-07-25: qty 7
  Filled 2016-07-25 (×3): qty 1

## 2016-07-25 NOTE — Progress Notes (Signed)
Patient has phone screening with [facility] in admissions at 8:30AM on Tuesday, 07/26/16. Pt informed and plans to call in the morning.   Trula SladeHeather Smart, MSW, LCSW Clinical Social Worker 07/25/2016 4:24 PM
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
## 2016-07-25 NOTE — Progress Notes (Signed)
  DATA ACTION RESPONSE  Objective- Pt. is visible in the room, seen resting in bed with eyes open. Presents with a depressed/anxious/irritable  affect and mood. Currently c/o of w/w symptoms. Minimal with interaction.  Subjective- Denies having any SI/HI/AVH/Pain at this time. Pt. states " My whole body aches".Is cooperative and remain safe on the unit.  1:1 interaction in private to establish rapport. Encouragement, education, & support given from staff.  PRN Ativan requested and will re-eval accordingly.   Safety maintained with Q 15 checks. Continue with POC.

## 2016-07-25 NOTE — Progress Notes (Signed)
Recreation Therapy Notes  Date: 07/25/16 Time: 0930 Location: 300 Hall Group Room  Group Topic: Stress Management  Goal Area(s) Addresses:  Patient will verbalize importance of using healthy stress management.  Patient will identify positive emotions associated with healthy stress management.   Behavioral Response: Engaged  Intervention: Stress Management  Activity :  Meditation.  LRT introduced the stress management technique of meditation.  LRT played Gallagher meditation from the Calm app to allow patients to participate in meditating.  Patients were to follow along with the meditation to fully engage in the technique.  Education:  Stress Management, Discharge Planning.   Education Outcome: Acknowledges edcuation/In group clarification offered/Needs additional education  Clinical Observations/Feedback: Pt did not attend group.   Cody Gallagher, LRT/CTRS         Cody Gallagher 07/25/2016 12:36 PM 

## 2016-07-25 NOTE — Progress Notes (Signed)
D: Pt denies SI/HI/AVH. Pt is pleasant and cooperative. Pt continued to be med focused. Pt stated he had a rough day, pt did better later in the evening.   A: Pt was offered support and encouragement. Pt was given scheduled medications. Pt was encourage to attend groups. Q 15 minute checks were done for safety.    R:Pt attends groups and interacts well with peers and staff. Pt is taking medication. Pt has no complaints.Pt receptive to treatment and safety maintained on unit.

## 2016-07-25 NOTE — [facility] Group Notes (Signed)
[facility] LCSW Group Therapy  07/25/2016 4:23 PM  Type of Therapy:  Group Therapy  Participation Level:  Did Not Attend-pt invited. Chose to remain in bed.   Summary of Progress/Problems: Today's Topic: Overcoming Obstacles. Patients identified one short term goal and potential obstacles in reaching this goal. Patients processed barriers involved in overcoming these obstacles. Patients identified steps necessary for overcoming these obstacles and explored motivation (internal and external) for facing these difficulties head on.   Ayanna Gheen N Smart LCSW 07/25/2016, 4:23 PM  

## 2016-07-25 NOTE — Plan of Care (Signed)
Problem: Safety: Goal: Periods of time without injury will increase Outcome: Progressing Pt. remains a low fall risk, denies SI/HI/AH, +VH, Q 15 checks in effect.

## 2016-07-25 NOTE — Progress Notes (Signed)
Patient ID: Cody Gallagher, male   DOB: 08/14/1980, 36 y.o.   MRN: [id number] D:Affect is appropriate to mood. Rates his depression as a 6 and his anxiety as 5 today. Remains focused on meds however has been pleasant and cooperative as well as attended all groups and activities. A:Support and encouragement offered. R:Receptive. No complaints of pain or problems at this time.

## 2016-07-25 NOTE — Progress Notes (Signed)
Pt did not attend wrap-up group   

## 2016-07-26 DIAGNOSIS — R45851 Suicidal ideations: Secondary | ICD-10-CM

## 2016-07-26 MED ORDER — ARIPIPRAZOLE 5 MG PO TABS
5.0000 mg | ORAL_TABLET | Freq: Two times a day (BID) | ORAL | Status: DC
  Administered 2016-07-26 – 2016-07-29 (×5): 5 mg via ORAL
  Filled 2016-07-26: qty 14
  Filled 2016-07-26 (×6): qty 1
  Filled 2016-07-26: qty 14
  Filled 2016-07-26 (×2): qty 1

## 2016-07-26 MED ORDER — LORAZEPAM 1 MG PO TABS
1.0000 mg | ORAL_TABLET | Freq: Three times a day (TID) | ORAL | Status: AC
  Administered 2016-07-27 – 2016-07-28 (×3): 1 mg via ORAL
  Filled 2016-07-26 (×3): qty 1

## 2016-07-26 MED ORDER — LORAZEPAM 1 MG PO TABS
1.0000 mg | ORAL_TABLET | Freq: Two times a day (BID) | ORAL | Status: DC
  Filled 2016-07-26: qty 1

## 2016-07-26 MED ORDER — HYDROXYZINE HCL 25 MG PO TABS
25.0000 mg | ORAL_TABLET | Freq: Four times a day (QID) | ORAL | Status: AC | PRN
  Administered 2016-07-28 (×2): 25 mg via ORAL
  Filled 2016-07-26 (×6): qty 1

## 2016-07-26 MED ORDER — LORAZEPAM 1 MG PO TABS
1.0000 mg | ORAL_TABLET | Freq: Four times a day (QID) | ORAL | Status: AC
  Administered 2016-07-26 – 2016-07-27 (×3): 1 mg via ORAL
  Filled 2016-07-26 (×3): qty 1

## 2016-07-26 MED ORDER — LORAZEPAM 1 MG PO TABS: 1.0000 mg | ORAL_TABLET | Freq: Four times a day (QID) | ORAL | Status: DC | PRN

## 2016-07-26 MED ORDER — VITAMIN B-1 100 MG PO TABS
100.0000 mg | ORAL_TABLET | Freq: Every day | ORAL | Status: DC
  Administered 2016-07-27 – 2016-07-29 (×3): 100 mg via ORAL
  Filled 2016-07-26 (×5): qty 1

## 2016-07-26 MED ORDER — ONDANSETRON 4 MG PO TBDP: 4.0000 mg | ORAL_TABLET | Freq: Four times a day (QID) | ORAL | Status: AC | PRN

## 2016-07-26 MED ORDER — ADULT MULTIVITAMIN W/MINERALS CH
1.0000 | ORAL_TABLET | Freq: Every day | ORAL | Status: DC
  Administered 2016-07-26 – 2016-07-29 (×4): 1 via ORAL
  Filled 2016-07-26 (×6): qty 1

## 2016-07-26 MED ORDER — LORAZEPAM 1 MG PO TABS: 1.0000 mg | ORAL_TABLET | Freq: Every day | ORAL | Status: DC

## 2016-07-26 MED ORDER — THIAMINE HCL 100 MG/ML IJ SOLN: 100.0000 mg | Freq: Once | INTRAMUSCULAR | Status: DC

## 2016-07-26 MED ORDER — LOPERAMIDE HCL 2 MG PO CAPS: 2.0000 mg | ORAL_CAPSULE | ORAL | Status: AC | PRN

## 2016-07-26 MED ORDER — SERTRALINE HCL 50 MG PO TABS
50.0000 mg | ORAL_TABLET | Freq: Every day | ORAL | Status: DC
  Administered 2016-07-27 – 2016-07-29 (×3): 50 mg via ORAL
  Filled 2016-07-26 (×2): qty 1
  Filled 2016-07-26: qty 7
  Filled 2016-07-26 (×2): qty 1

## 2016-07-26 NOTE — Progress Notes (Signed)
Recreation Therapy Notes  Animal-Assisted Activity (AAA) Program Checklist/Progress Notes Patient Eligibility Criteria Checklist & Daily Group note for Rec TxIntervention  Date: 07/26/2016 Time: 2:55pm Location: 400 hall dayroom  AAA/T Program Assumption of Risk Form signed by Patient/ or Parent Legal Guardian Yes  Patient is free of allergies or sever asthma Yes  Patient reports no fear of animals Yes  Patient reports no history of cruelty to animals Yes  Patient understands his/her participation is voluntary Yes  Patient washes hands before animal contact Yes  Patient washes hands after animal contact Yes  Behavioral Response: Patient did not attend.  Lynard Postlewait, Recreation Therapy Intern 

## 2016-07-26 NOTE — Plan of Care (Signed)
Problem: Coping: Goal: Ability to cope will improve Outcome: Not Progressing Patient continues to rate depression and hopelessness at a 10/10. Endorses passive SI.  Problem: Medication: Goal: Compliance with prescribed medication regimen will improve Outcome: Progressing Patient is med compliant.

## 2016-07-26 NOTE — [facility] Group Notes (Signed)
[facility] Group Notes:  (Nursing/MHT/Case Management/Adjunct)  Date:  07/26/2016  Time:  0900  Type of Therapy:  Nurse Education - Successful Goal Setting  Participation Level:  Did Not Attend  Participation Quality:    Affect:    Cognitive:    Insight:    Engagement in Group:    Modes of Intervention:    Summary of Progress/Problems: Patient invited however elected not to attend.  Cody Gallagher, Cody Gallagher 07/26/2016, 0930

## 2016-07-26 NOTE — Progress Notes (Signed)
Alerted by MHT that after patient selected food in the cafeteria he became weak, dizzy and nauseated. Assisted to the floor to sit (did not fall), wheelchair provided and patient assisted back to unit/bed. VS obtained - 106/59, pulse 53. Patient states, "what is going on with me?"   Pitcher of fluids provided along with lunch tray as patient now feels able to eat. Fall precautions initiated and reviewed. NP made aware.  Patient verbalizes understanding. Will continue to monitor closely and recheck VS.

## 2016-07-26 NOTE — Progress Notes (Signed)
Pt girlfriend approached nurse and said the patient is talking crazy and is saying he wants to kill himself   She doesn't think he is ready for discharge tomorrow
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
## 2016-07-26 NOTE — Progress Notes (Signed)
Staff explain to pt he can not hang out in the group room while wrap up group is going on. Staff explain he can go to his room or wrap up group. Pt was very rude pt said he do not want to attend wrap up group. Pt said very rude he will draw the entire time he is in wrap up group. Pt goes to wrap group and he draw the entire time he is in wrap up group. RN notify

## 2016-07-26 NOTE — Progress Notes (Signed)
D    Pt has been calm and mostly cooperative except for the issue of drawing in group   He was bragging at the medication window how he had been in prison in 3 or 4 different states and has seen everything    He does not appear to be in distress or contemplating harming himself and does contract for safety  A    Verbal support and encouragement given    Medications administered and effectiveness monitored   Q 15 min checks R    Pt is safe at present time

## 2016-07-26 NOTE — Progress Notes (Signed)
D: Spoke with patient 1:1 who has been up and visible on the unit since initially sleeping in this AM. Patient states his sleep was quite poor as roommate disturbed him several times. Patient's affect initially flat, irritable with congruent mood however patient became more relaxed as day progressed. Rating depression, hopelessness and anxiety all at a 10/10. Rates sleep as poor, appetite as poor, energy as low and concentration as poor.  States goal for today is to "do interview with [facility]" however patient was declined due to legal charges. Denies pain, physical problems except for lunch time episode - see previous DAR note.   A: Medicated per orders, prn ativan given. This morning Lucianne MussKumar, MD discontinued ativan 2mg  prn that patient had been taking 2-3 times a day therefore order was obtained to begin ativan protocol to safely taper. Emotional support offered and self inventory reviewed. Encouraged completion of Suicide Safety Plan. Discussed POC with MD, SW.  Fall prevention plan in place and reviewed.   R: Patient verbalizes understanding of POC, falls teaching. Patient endorsing passive SI stating, "I can't go back to that shelter. I will just relapse and kill myself. I have a place to go but not for a week. Can't you just keep me a week?" Patient verbally contracts for safety. Denies plan or intent to harm self. No HI and remains safe on level III obs. Will continue to monitor closely.

## 2016-07-26 NOTE — [facility] Suicide Risk Assessment (Signed)
[facility] INPATIENT:  Family/Significant Other Suicide Prevention Education  Suicide Prevention Education:  Education Completed; Cody Gallagher (pt's wife) [phone] has been identified by the patient as the family member/significant other with whom the patient will be residing, and identified as the person(s) who will aid the patient in the event of a mental health crisis (suicidal ideations/suicide attempt).  With written consent from the patient, the family member/significant other has been provided the following suicide prevention education, prior to the and/or following the discharge of the patient.  The suicide prevention education provided includes the following:  Suicide risk factors  Suicide prevention and interventions  National Suicide Hotline telephone number  [facility] assessment telephone number  [facility] 911  [facility] and/or Residential Mobile Crisis Unit telephone number  Request made of family/significant other to:  Remove weapons (e.g., guns, rifles, knives), all items previously/currently identified as safety concern.    Remove drugs/medications (over-the-counter, prescriptions, illicit drugs), all items previously/currently identified as a safety concern.  The family member/significant other verbalizes understanding of the suicide prevention education information provided.  The family member/significant other agrees to remove the items of safety concern listed above.  Pt's [facility]. She states that pt struggles with being homeless and living in tent "it's hard for him to get into shelters or treatment because of his charges and past." She states that in 2 weeks they will be moving in a 2 bedroom apt but in the meantime he is homeless. [facility]. She was made aware that he will likely discharge tomorrow.  Pt's wife states that his SI is chronic "especially when he is homeless." She denies that he has access to guns/firearms.   Lataunya Ruud N Smart LCSW 07/26/2016, 10:54 AM

## 2016-07-27 DIAGNOSIS — F39 Unspecified mood [affective] disorder: Secondary | ICD-10-CM

## 2016-07-27 DIAGNOSIS — F1990 Other psychoactive substance use, unspecified, uncomplicated: Secondary | ICD-10-CM

## 2016-07-27 DIAGNOSIS — F1190 Opioid use, unspecified, uncomplicated: Secondary | ICD-10-CM

## 2016-07-27 NOTE — Progress Notes (Signed)
Recreation Therapy Notes  Date: 07/27/16 Time: 0930 Location: 300 [address] Dayroom  Group Topic: Stress Management  Goal Area(s) Addresses:  Patient will verbalize importance of using healthy stress management.  Patient will identify positive emotions associated with healthy stress management.   Intervention: Stress Managemen  Activity :  LRT introduced the stress management technique of guided imagery.  LRT read a script to allow patients to engage in the activity.  Patients were to follow along as LRT read script to participate in activity.  Education:  Stress Management, Discharge Planning.   Education Outcome: Acknowledges edcuation/In group clarification offered/Needs additional education  Clinical Observations/Feedback: Pt did not attend group.   Dyna Figuereo, LRT/CTRS         Jaqua Ching A 07/27/2016 2:36 PM 

## 2016-07-27 NOTE — [facility] Group Notes (Signed)
[facility] LCSW Group Therapy 07/27/2016 1:15 PM  Type of Therapy: Group Therapy- Emotion Regulation  Participation Level: Reserved  Participation Quality:  Appropriate  Affect: Lethargic   Cognitive: Alert and Oriented   Insight:  Developing/Improving  Engagement in Therapy: Developing/Improving and Engaged   Modes of Intervention: Clarification, Confrontation, Discussion, Education, Exploration, Limit-setting, Orientation, Problem-solving, Rapport Building, Dance movement psychotherapisteality Testing, Socialization and Support  Summary of Progress/Problems: The topic for group today was emotional regulation. This group focused on both positive and negative emotion identification and allowed group members to process ways to identify feelings, regulate negative emotions, and find healthy ways to manage internal/external emotions. Group members were asked to reflect on a time when their reaction to an emotion led to a negative outcome and explored how alternative responses using emotion regulation would have benefited them. Group members were also asked to discuss a time when emotion regulation was utilized when a negative emotion was experienced. Pt expressed that he has trust issues with many people which causes angry reactions often.    Vernie ShanksLauren Marguerette Sheller, LCSW 07/27/2016 3:28 PM

## 2016-07-27 NOTE — Tx Team (Signed)
Interdisciplinary Treatment and Diagnostic Plan Update  07/27/2016 Time of Session: 0930 Renold Gentanthony Langille MRN: [id number]  Principal Diagnosis: Alcohol Use Disorder  Secondary Diagnoses: Active Problems:   Bipolar 1 disorder (HCC)   Current Medications:  Current Facility-Administered Medications  Medication Dose Route Frequency Provider Last Rate Last Dose  . acetaminophen (TYLENOL) tablet 650 mg  650 mg Oral Q6H PRN Burnard LeighEksir, Alexander Arya, MD   650 mg at 07/23/16 0904  . alum & mag hydroxide-simeth (MAALOX/MYLANTA) 200-200-20 MG/5ML suspension 30 mL  30 mL Oral Q4H PRN Burnard LeighEksir, Alexander Arya, MD   30 mL at 07/25/16 0513  . ARIPiprazole (ABILIFY) tablet 5 mg  5 mg Oral BID AC & HS Nelly RoutKumar, Archana, MD   5 mg at 07/27/16 0818  . gabapentin (NEURONTIN) capsule 300 mg  300 mg Oral TID Cobos, Rockey SituFernando A, MD   300 mg at 07/27/16 0818  . hydrOXYzine (ATARAX/VISTARIL) tablet 25 mg  25 mg Oral Q6H PRN Armandina StammerNwoko, Agnes I, NP      . ibuprofen (ADVIL,MOTRIN) tablet 600 mg  600 mg Oral Q6H PRN Armandina StammerNwoko, Agnes I, NP   600 mg at 07/24/16 1339  . lidocaine (LIDODERM) 5 % 1 patch  1 patch Transdermal Q24H Oneta RackLewis, Tanika N, NP   1 patch at 07/25/16 2109  . loperamide (IMODIUM) capsule 2-4 mg  2-4 mg Oral PRN Nwoko, Agnes I, NP      . LORazepam (ATIVAN) tablet 1 mg  1 mg Oral Q6H PRN Nwoko, Agnes I, NP      . LORazepam (ATIVAN) tablet 1 mg  1 mg Oral QID Armandina StammerNwoko, Agnes I, NP   1 mg at 07/27/16 0818   Followed by  . LORazepam (ATIVAN) tablet 1 mg  1 mg Oral TID Armandina StammerNwoko, Agnes I, NP       Followed by  . [START ON 07/28/2016] LORazepam (ATIVAN) tablet 1 mg  1 mg Oral BID Armandina StammerNwoko, Agnes I, NP       Followed by  . [START ON 07/30/2016] LORazepam (ATIVAN) tablet 1 mg  1 mg Oral Daily Nwoko, Agnes I, NP      . magnesium hydroxide (MILK OF MAGNESIA) suspension 30 mL  30 mL Oral Daily PRN Rene KocherEksir, Bo McclintockAlexander Arya, MD      . multivitamin with minerals tablet 1 tablet  1 tablet Oral Daily Nwoko, Agnes I, NP   1 tablet at 07/27/16 0817   . nicotine (NICODERM CQ - dosed in mg/24 hours) patch 21 mg  21 mg Transdermal Q0600 Nelly RoutKumar, Archana, MD   21 mg at 07/27/16 [id number]  . ondansetron (ZOFRAN-ODT) disintegrating tablet 4 mg  4 mg Oral Q6H PRN Nwoko, Agnes I, NP      . sertraline (ZOLOFT) tablet 50 mg  50 mg Oral Daily Nelly RoutKumar, Archana, MD   50 mg at 07/27/16 0818  . thiamine (B-1) injection 100 mg  100 mg Intramuscular Once Nwoko, Nicole KindredAgnes I, NP      . thiamine (VITAMIN B-1) tablet 100 mg  100 mg Oral Daily Nwoko, Agnes I, NP   100 mg at 07/27/16 0818   PTA Medications: Prescriptions Prior to Admission  Medication Sig Dispense Refill Last Dose  . promethazine (PHENERGAN) 25 MG tablet Take 1 tablet (25 mg total) by mouth every 6 (six) hours as needed for nausea or vomiting. (Patient not taking: Reported on 07/21/2016) 12 tablet 0 Completed Course at Unknown time    Patient Stressors: Legal issue Marital or family conflict Substance abuse  Patient Strengths: Ability for  insight Average or above average intelligence Motivation for treatment/growth Religious Affiliation Supportive family/friends  Treatment Modalities: Medication Management, Group therapy, Case management,  1 to 1 session with clinician, Psychoeducation, Recreational therapy.   Physician Treatment Plan for Primary Diagnosis: Alcohol Use Disorder Long Term Goal(s): Improvement in symptoms so as ready for discharge Improvement in symptoms so as ready for discharge   Short Term Goals: Ability to identify changes in lifestyle to reduce recurrence of condition will improve Ability to verbalize feelings will improve Ability to disclose and discuss suicidal ideas Ability to demonstrate self-control will improve Ability to identify and develop effective coping behaviors will improve Ability to maintain clinical measurements within normal limits will improve Compliance with prescribed medications will improve Ability to identify triggers associated with substance  abuse/mental health issues will improve Ability to identify changes in lifestyle to reduce recurrence of condition will improve Ability to verbalize feelings will improve Ability to disclose and discuss suicidal ideas Ability to demonstrate self-control will improve Ability to identify and develop effective coping behaviors will improve Ability to maintain clinical measurements within normal limits will improve Compliance with prescribed medications will improve Ability to identify triggers associated with substance abuse/mental health issues will improve  Medication Management: Evaluate patient's response, side effects, and tolerance of medication regimen.  Therapeutic Interventions: 1 to 1 sessions, Unit Group sessions and Medication administration.  Evaluation of Outcomes: Progressing  Physician Treatment Plan for Secondary Diagnosis: Active Problems:   Bipolar 1 disorder (HCC)  Long Term Goal(s): Improvement in symptoms so as ready for discharge Improvement in symptoms so as ready for discharge   Short Term Goals: Ability to identify changes in lifestyle to reduce recurrence of condition will improve Ability to verbalize feelings will improve Ability to disclose and discuss suicidal ideas Ability to demonstrate self-control will improve Ability to identify and develop effective coping behaviors will improve Ability to maintain clinical measurements within normal limits will improve Compliance with prescribed medications will improve Ability to identify triggers associated with substance abuse/mental health issues will improve Ability to identify changes in lifestyle to reduce recurrence of condition will improve Ability to verbalize feelings will improve Ability to disclose and discuss suicidal ideas Ability to demonstrate self-control will improve Ability to identify and develop effective coping behaviors will improve Ability to maintain clinical measurements within normal  limits will improve Compliance with prescribed medications will improve Ability to identify triggers associated with substance abuse/mental health issues will improve     Medication Management: Evaluate patient's response, side effects, and tolerance of medication regimen.  Therapeutic Interventions: 1 to 1 sessions, Unit Group sessions and Medication administration.  Evaluation of Outcomes: Progressing   RN Treatment Plan for Primary Diagnosis: Alcohol Use Disorder Long Term Goal(s): Knowledge of disease and therapeutic regimen to maintain health will improve  Short Term Goals: Ability to remain free from injury will improve, Ability to disclose and discuss suicidal ideas and Ability to identify and develop effective coping behaviors will improve  Medication Management: RN will administer medications as ordered by provider, will assess and evaluate patient's response and provide education to patient for prescribed medication. RN will report any adverse and/or side effects to prescribing provider.  Therapeutic Interventions: 1 on 1 counseling sessions, Psychoeducation, Medication administration, Evaluate responses to treatment, Monitor vital signs and CBGs as ordered, Perform/monitor CIWA, COWS, AIMS and Fall Risk screenings as ordered, Perform wound care treatments as ordered.  Evaluation of Outcomes: Progressing   LCSW Treatment Plan for Primary Diagnosis: Alcohol Use Disorder Long  Term Goal(s): Safe transition to appropriate next level of care at discharge, Engage patient in therapeutic group addressing interpersonal concerns.  Short Term Goals: Engage patient in aftercare planning with referrals and resources, Facilitate patient progression through stages of change regarding substance use diagnoses and concerns and Identify triggers associated with mental health/substance abuse issues  Therapeutic Interventions: Assess for all discharge needs, 1 to 1 time with Social worker, Explore  available resources and support systems, Assess for adequacy in community support network, Educate family and significant other(s) on suicide prevention, Complete Psychosocial Assessment, Interpersonal group therapy.  Evaluation of Outcomes: Progressing   Progress in Treatment: Attending groups: No. New to unit. Continuing to assess.  Participating in groups: No. Taking medication as prescribed: Yes. Toleration medication: Yes. Family/Significant other contact made: No, will contact:  family member if patient consents. Patient understands diagnosis: Yes. Discussing patient identified problems/goals with staff: No. Medical problems stabilized or resolved: No. Denies suicidal/homicidal ideation: No, pt currently endorsing passive SI/Able to contract for safety on the unit.  Issues/concerns per patient self-inventory: No. Other: n/a   New problem(s) identified: No, Describe:  n/a  New Short Term/Long Term Goal(s): elimination of SI thoughts; medication stabilization; detox, development of comprehensive mental wellness/sobriety plan.   Discharge Plan or Barriers: CSW assessing for appropriate referrals. Pt was recently at [facility] but left after 30 hours and may not be eligible to return for 90 days. [facility] is possibly if pt is still interested in inpatient treatment. Pt identifies as homeless currently and has no current outpatient mental health providers.   Reason for Continuation of Hospitalization: Anxiety Depression Medication stabilization Suicidal ideation Withdrawal symptoms  Estimated Length of Stay: 3-5 days   Attendees: Patient: 07/27/2016 11:48 AM  Physician: Dr. Jackquline Berlin MD 07/27/2016 11:48 AM  Nursing: Elpidio Galea RN 07/27/2016 11:48 AM  RN Care Manager: Onnie Boer CM 07/27/2016 11:48 AM  Social Worker: Trula Slade, LCSW 07/27/2016 11:48 AM  Recreational Therapist: x 07/27/2016 11:48 AM  Other: Armandina Stammer NP 07/27/2016 11:48 AM  Other:  07/27/2016 11:48 AM   Other: 07/27/2016 11:48 AM    Scribe for Treatment Team: Ledell Peoples Smart, LCSW 07/27/2016 11:48 AM

## 2016-07-27 NOTE — Plan of Care (Signed)
Problem: Safety: Goal: Ability to remain free from injury will improve Outcome: Progressing Although patient has endorsed passive SI, he has not engaged in self harm. Verbal contract for safety in place.

## 2016-07-27 NOTE — Plan of Care (Signed)
Problem: Medication: Goal: Compliance with prescribed medication regimen will improve Outcome: Progressing Pt is taking meds as prescribed.    

## 2016-07-27 NOTE — Progress Notes (Signed)
Psychoeducational Group Note  Date:  07/27/2016 Time:  1350  Group Topic/Focus:  Personal Choices and Values:   The focus of this group is to help patients assess and explore the importance of values in their lives, how their values affect their decisions, how they express their values and what opposes their expression.  Participation Level: Did Not Attend  Participation Quality:  Not Applicable  Affect:  Not Applicable  Cognitive:  Not Applicable  Insight:  Not Applicable  Engagement in Group: Not Applicable  Additional Comments:  Pt was medicated and asleep and as a result unable to attend group this morning.  Unique Searfoss E 07/27/2016, 2:01 PM

## 2016-07-27 NOTE — Tx Team (Signed)
Interdisciplinary Treatment and Diagnostic Plan Update  07/27/2016 Time of Session: 0930 Cody Gallagher MRN: [id number]  Principal Diagnosis: Alcohol Use Disorder  Secondary Diagnoses: Active Problems:   Bipolar 1 disorder (HCC)   Current Medications:  Current Facility-Administered Medications  Medication Dose Route Frequency Provider Last Rate Last Dose  . acetaminophen (TYLENOL) tablet 650 mg  650 mg Oral Q6H PRN Burnard LeighEksir, Alexander Arya, MD   650 mg at 07/23/16 0904  . alum & mag hydroxide-simeth (MAALOX/MYLANTA) 200-200-20 MG/5ML suspension 30 mL  30 mL Oral Q4H PRN Burnard LeighEksir, Alexander Arya, MD   30 mL at 07/25/16 0513  . ARIPiprazole (ABILIFY) tablet 5 mg  5 mg Oral BID AC & HS Nelly RoutKumar, Archana, MD   5 mg at 07/27/16 0818  . gabapentin (NEURONTIN) capsule 300 mg  300 mg Oral TID Cobos, Rockey SituFernando A, MD   300 mg at 07/27/16 0818  . hydrOXYzine (ATARAX/VISTARIL) tablet 25 mg  25 mg Oral Q6H PRN Armandina StammerNwoko, Agnes I, NP      . ibuprofen (ADVIL,MOTRIN) tablet 600 mg  600 mg Oral Q6H PRN Armandina StammerNwoko, Agnes I, NP   600 mg at 07/24/16 1339  . lidocaine (LIDODERM) 5 % 1 patch  1 patch Transdermal Q24H Oneta RackLewis, Tanika N, NP   1 patch at 07/25/16 2109  . loperamide (IMODIUM) capsule 2-4 mg  2-4 mg Oral PRN Nwoko, Agnes I, NP      . LORazepam (ATIVAN) tablet 1 mg  1 mg Oral Q6H PRN Nwoko, Agnes I, NP      . LORazepam (ATIVAN) tablet 1 mg  1 mg Oral TID Armandina StammerNwoko, Agnes I, NP       Followed by  . [START ON 07/28/2016] LORazepam (ATIVAN) tablet 1 mg  1 mg Oral BID Armandina StammerNwoko, Agnes I, NP       Followed by  . [START ON 07/30/2016] LORazepam (ATIVAN) tablet 1 mg  1 mg Oral Daily Nwoko, Agnes I, NP      . magnesium hydroxide (MILK OF MAGNESIA) suspension 30 mL  30 mL Oral Daily PRN Rene KocherEksir, Bo McclintockAlexander Arya, MD      . multivitamin with minerals tablet 1 tablet  1 tablet Oral Daily Nwoko, Agnes I, NP   1 tablet at 07/27/16 0817  . nicotine (NICODERM CQ - dosed in mg/24 hours) patch 21 mg  21 mg Transdermal Q0600 Nelly RoutKumar, Archana, MD   21  mg at 07/27/16 [id number]  . ondansetron (ZOFRAN-ODT) disintegrating tablet 4 mg  4 mg Oral Q6H PRN Nwoko, Agnes I, NP      . sertraline (ZOLOFT) tablet 50 mg  50 mg Oral Daily Nelly RoutKumar, Archana, MD   50 mg at 07/27/16 0818  . thiamine (B-1) injection 100 mg  100 mg Intramuscular Once Nwoko, Nicole KindredAgnes I, NP      . thiamine (VITAMIN B-1) tablet 100 mg  100 mg Oral Daily Nwoko, Agnes I, NP   100 mg at 07/27/16 0818   PTA Medications: Prescriptions Prior to Admission  Medication Sig Dispense Refill Last Dose  . promethazine (PHENERGAN) 25 MG tablet Take 1 tablet (25 mg total) by mouth every 6 (six) hours as needed for nausea or vomiting. (Patient not taking: Reported on 07/21/2016) 12 tablet 0 Completed Course at Unknown time    Patient Stressors: Legal issue Marital or family conflict Substance abuse  Patient Strengths: Ability for insight Average or above average intelligence Motivation for treatment/growth Religious Affiliation Supportive family/friends  Treatment Modalities: Medication Management, Group therapy, Case management,  1 to 1 session  with clinician, Psychoeducation, Recreational therapy.   Physician Treatment Plan for Primary Diagnosis: Alcohol Use Disorder Long Term Goal(s): Improvement in symptoms so as ready for discharge Improvement in symptoms so as ready for discharge   Short Term Goals: Ability to identify changes in lifestyle to reduce recurrence of condition will improve Ability to verbalize feelings will improve Ability to disclose and discuss suicidal ideas Ability to demonstrate self-control will improve Ability to identify and develop effective coping behaviors will improve Ability to maintain clinical measurements within normal limits will improve Compliance with prescribed medications will improve Ability to identify triggers associated with substance abuse/mental health issues will improve Ability to identify changes in lifestyle to reduce recurrence of condition  will improve Ability to verbalize feelings will improve Ability to disclose and discuss suicidal ideas Ability to demonstrate self-control will improve Ability to identify and develop effective coping behaviors will improve Ability to maintain clinical measurements within normal limits will improve Compliance with prescribed medications will improve Ability to identify triggers associated with substance abuse/mental health issues will improve  Medication Management: Evaluate patient's response, side effects, and tolerance of medication regimen.  Therapeutic Interventions: 1 to 1 sessions, Unit Group sessions and Medication administration.  Evaluation of Outcomes: Adequate for Discharge   Physician Treatment Plan for Secondary Diagnosis: Active Problems:   Bipolar 1 disorder (HCC)  Long Term Goal(s): Improvement in symptoms so as ready for discharge Improvement in symptoms so as ready for discharge   Short Term Goals: Ability to identify changes in lifestyle to reduce recurrence of condition will improve Ability to verbalize feelings will improve Ability to disclose and discuss suicidal ideas Ability to demonstrate self-control will improve Ability to identify and develop effective coping behaviors will improve Ability to maintain clinical measurements within normal limits will improve Compliance with prescribed medications will improve Ability to identify triggers associated with substance abuse/mental health issues will improve Ability to identify changes in lifestyle to reduce recurrence of condition will improve Ability to verbalize feelings will improve Ability to disclose and discuss suicidal ideas Ability to demonstrate self-control will improve Ability to identify and develop effective coping behaviors will improve Ability to maintain clinical measurements within normal limits will improve Compliance with prescribed medications will improve Ability to identify triggers  associated with substance abuse/mental health issues will improve     Medication Management: Evaluate patient's response, side effects, and tolerance of medication regimen.  Therapeutic Interventions: 1 to 1 sessions, Unit Group sessions and Medication administration.  Evaluation of Outcomes: Adequate for discharge    RN Treatment Plan for Primary Diagnosis: Alcohol Use Disorder Long Term Goal(s): Knowledge of disease and therapeutic regimen to maintain health will improve  Short Term Goals: Ability to remain free from injury will improve, Ability to disclose and discuss suicidal ideas and Ability to identify and develop effective coping behaviors will improve  Medication Management: RN will administer medications as ordered by provider, will assess and evaluate patient's response and provide education to patient for prescribed medication. RN will report any adverse and/or side effects to prescribing provider.  Therapeutic Interventions: 1 on 1 counseling sessions, Psychoeducation, Medication administration, Evaluate responses to treatment, Monitor vital signs and CBGs as ordered, Perform/monitor CIWA, COWS, AIMS and Fall Risk screenings as ordered, Perform wound care treatments as ordered.  Evaluation of Outcomes: Adequate for discharge    LCSW Treatment Plan for Primary Diagnosis: Alcohol Use Disorder Long Term Goal(s): Safe transition to appropriate next level of care at discharge, Engage patient in therapeutic group addressing  interpersonal concerns.  Short Term Goals: Engage patient in aftercare planning with referrals and resources, Facilitate patient progression through stages of change regarding substance use diagnoses and concerns and Identify triggers associated with mental health/substance abuse issues  Therapeutic Interventions: Assess for all discharge needs, 1 to 1 time with Social worker, Explore available resources and support systems, Assess for adequacy in community  support network, Educate family and significant other(s) on suicide prevention, Complete Psychosocial Assessment, Interpersonal group therapy.  Evaluation of Outcomes: Adequate for discharge   Progress in Treatment: Attending groups: No. refuses to attend groups; however at times, will not leave dayroom and is disruptive during group.  Participating in groups: No. Taking medication as prescribed: Yes. Toleration medication: Yes. Family/Significant other contact made: SPE completed with pt's girlfriend.  Patient understands diagnosis: Yes. Discussing patient identified problems/goals with staff: No. Medical problems stabilized or resolved: No. Denies suicidal/homicidal ideation: Yes, pt reports no SI currently. Tells some staff that he is SI throughout day.  Issues/concerns per patient self-inventory: No. Other: n/a   New problem(s) identified: No, Describe:  n/a  New Short Term/Long Term Goal(s): elimination of SI thoughts; medication stabilization; detox, development of comprehensive mental wellness/sobriety plan.   Discharge Plan or Barriers: CSW assessing for appropriate referrals. Pt was recently at [facility] but left after 30 hours and may not be eligible to return for 90 days. [facility] is possibly if pt is still interested in inpatient treatment. Pt identifies as homeless currently and has no current outpatient mental health providers.  6/13: Pt declined at [facility] due to assault charge that is pending. No beds at shelters currently--pt states he will be unable to get into shelter due to his "history." Pt has tent that he has been living in and can return there if necessary. Pt reports that he and his gf are moving into an apt in a week and asked to stay a week in the hospital to avoid being homeless. Pt given information to the [facility]; reports he is not interested.   Reason for Continuation of Hospitalization: Chronic SI/medication management   Estimated  Length of Stay: 1 day (tentative discharge on Thursday)  Attendees: Patient: 07/27/2016 12:56 PM  Physician: Dr. Jama Flavors MD 07/27/2016 12:56 PM  Nursing: Delana Meyer RN 07/27/2016 12:56 PM  RN Care Manager: Onnie Boer CM 07/27/2016 12:56 PM  Social Worker: Trula Slade, LCSW 07/27/2016 12:56 PM  Recreational Therapist: x 07/27/2016 12:56 PM  Other: Armandina Stammer NP 07/27/2016 12:56 PM  Other:  07/27/2016 12:56 PM  Other: 07/27/2016 12:56 PM    Scribe for Treatment Team: Ledell Peoples Smart, LCSW 07/27/2016 12:56 PM
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
## 2016-07-27 NOTE — Progress Notes (Addendum)
D: Spoke with patient 1:1. Initially patient resting in bed this AM. Flat, depressed and verbalizing concern about discharge plan. "I'm not ready. I'm scared. I don't want to go use and I think that's what will happen if I go back to that same shelter. Is there another shelter I can go to?" Patient later came to this writer stating his housing will be available on Friday. Patient calmer expressing relief. Patient rates his depression at a 9/10, hopelessness at a 10/10 and anxiety at a 7/10. Rates sleep as poor, appetite as fair, energy as low and concentration as poor.  States goal for today is to "take it easy." Complaining of back pain of a 6-8/10 however does not wish to take prn. No other physical complaints. CIWAs have been low with only anxiety noted.  A: Medicated per orders, no prns required or requested. Emotional support offered and self inventory reviewed. Encouraged completion of Suicide Safety Plan. Discussed POC with MD, SW. Left message for Herbert SetaHeather in SW regarding update on housing. Fall prevention plan in place and reviewed as patient had near fall yesterday.   R: Patient verbalizes understanding of POC, falls teaching. Patient endorsing passive SI and verbal contract remains in place. "I won't hurt myself here. I just don't want to leave and relapse." Endorsing VH however does not appear to respond to internal stimuli. No HI and remains safe on level III obs. Will continue to monitor closely.

## 2016-07-28 MED ORDER — LORAZEPAM 1 MG PO TABS
1.0000 mg | ORAL_TABLET | Freq: Four times a day (QID) | ORAL | Status: AC | PRN
  Administered 2016-07-28 – 2016-07-29 (×3): 1 mg via ORAL
  Filled 2016-07-28 (×2): qty 1
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
## 2016-07-28 NOTE — Progress Notes (Signed)
Pt did not attend NA group meeting.  

## 2016-07-28 NOTE — Progress Notes (Signed)
  DATA ACTION RESPONSE  Objective- Pt. is visible in the dayroom, seen watching TV. Presents with a labile    affect and mood. Pt. is excited to be d/c Friday. Pt. states living situation has work out. No further c/o. No abnormal s/s.  Subjective- Denies having any SI/HI/AVH/Pain at this time. Pt. states " I'm not going to wait on you, I wont take my meds." Remains safe on the unit.  1:1 interaction in private to establish rapport. Encouragement, education, & support given from staff.  Refused bedtime meds.   Safety maintained with Q 15 checks. Continue with POC.
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
## 2016-07-28 NOTE — Progress Notes (Signed)
Patient ID: Cody Gallagher, male   DOB: 12/09/1980, 36 y.o.   MRN: [id number]  D: Patient denies SI/HI and auditory and visual hallucinations. Patient has a depressed mood and affect. Labile. Can be intrusive. Has poor boundaries. No complaints this AM.  A: Patient given emotional support from RN. Patient given medications per MD orders. Patient encouraged to attend groups and unit activities. Patient encouraged to come to staff with any questions or concerns.  R: Patient remains cooperative and appropriate. Will continue to monitor patient for safety.

## 2016-07-29 MED ORDER — SERTRALINE HCL 50 MG PO TABS: 50.0000 mg | ORAL_TABLET | Freq: Every day | ORAL | 0 refills | Status: DC

## 2016-07-29 MED ORDER — ARIPIPRAZOLE 5 MG PO TABS: 5.0000 mg | ORAL_TABLET | Freq: Two times a day (BID) | ORAL | 0 refills | Status: DC

## 2016-07-29 MED ORDER — ENSURE ENLIVE PO LIQD
237.0000 mL | Freq: Three times a day (TID) | ORAL | Status: DC
  Administered 2016-07-29: 237 mL via ORAL

## 2016-07-29 MED ORDER — NICOTINE 21 MG/24HR TD PT24: 21.0000 mg | MEDICATED_PATCH | Freq: Every day | TRANSDERMAL | 0 refills | Status: DC

## 2016-07-29 MED ORDER — LIDOCAINE 5 % EX PTCH: 1.0000 | MEDICATED_PATCH | CUTANEOUS | 0 refills | Status: DC

## 2016-07-29 MED ORDER — GABAPENTIN 300 MG PO CAPS: 300.0000 mg | ORAL_CAPSULE | Freq: Three times a day (TID) | ORAL | 0 refills | Status: DC

## 2016-07-29 MED ORDER — HYDROXYZINE HCL 25 MG PO TABS: ORAL_TABLET | ORAL | 0 refills | Status: DC
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
## 2016-07-29 NOTE — Progress Notes (Signed)
Data. Patient denies SI/HI/AVH. Patient interacting well with staff and other patients. Affect is bright most of the time, but does report feeling, "Really anxious", at times. "I am really looking forward to leaving, but I am worried how it will go with my fiance."  Action. Emotional support and encouragement offered. Education provided on medication, indications and side effect. Q 15 minute checks done for safety. Response. Safety on the unit maintained through 15 minute checks.  Medications taken as prescribed. Remained calm and appropriate through out shift.  Pt. discharged to lobby.  Belongings sheet reviewed and signed by pt. and all belongings, including bus pass, scripts and sample medications, sent home. Paperwork reviewed and pt. able to verbalize understanding of education. Pt. in no current distress and ambulatory.
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
## 2016-07-29 NOTE — Progress Notes (Signed)
D: Pt was in the hallway upon initial approach.  Pt presents with anxious affect and mood.  He reports he had "a lot of anxiety, a panic attack earlier today."  Pt denies SI/HI, denies hallucinations, denies pain.  He did not attend evening group.  A: Introduced self to pt.  Actively listened to pt and offered support and encouragement. Medication offered per order.  PRN medication administered for anxiety.  R: Pt is safe on the unit.  Pt refused scheduled lidocaine patch.  Pt verbally contracts for safety.  Will continue to monitor and assess.

## 2016-07-29 NOTE — Progress Notes (Signed)
  [facility] :  Will you be returning to the same living situation after discharge:  No.Pt plans to move in with girlfriend at discharge.  At discharge, do you have transportation home?: Yes,  bus pass Do you have the ability to pay for your medications: Yes,  mental health  Release of information consent forms completed and submitted to medical records by CSW.  Patient to Follow up at: Follow-up Information    [facility] Follow up.   Specialty:  Behavioral Health Why:  Walk in within 7 days of hospital discharge to be assessed for outpatient mental health services including: medication management, counseling, support groups. Walk in hours: Mon-Fri 8am-9am. Thank you.  Contact information: [address] [place] [id number] [phone]           Next level of care provider has [facility] to [facility] Link:no  Safety Planning and Suicide Prevention discussed: Yes,  SPE completed with both pt and his girlfriend. Mobile Crisis information and SPI pamphlet provided  Have you used any form of tobacco in the last 30 days? (Cigarettes, Smokeless Tobacco, Cigars, and/or Pipes): Yes  Has patient been referred to the Quitline?: Patient refused referral  Patient has been referred for addiction treatment: Yes  Kareen Jefferys N Smart LCSW 07/29/2016, 11:02 AM

## 2016-07-29 NOTE — [facility] Suicide Risk Assessment (Signed)
[facility]   Principal Problem:  Substance Use Disorder, Depression Discharge Diagnoses:  Patient Active Problem List   Diagnosis Date Noted  . Bipolar 1 disorder (HCC) [F31.9] 07/21/2016  . Cocaine abuse with cocaine-induced mood disorder [facility](HCC) [F14.14] 06/10/2016    Total Time spent with patient: 30 minutes  Musculoskeletal: Strength & Muscle Tone: within normal limits Gait & Station: normal Patient leans: N/A  Psychiatric Specialty Exam: ROS denies chest pain, no dyspnea, no vomiting, no fever  Blood pressure 113/78, pulse 69, temperature 97.8 F (36.6 C), resp. rate 16, height 6\' 1"  (1.854 m), weight 59.9 kg (132 lb), SpO2 100 %.Body mass index is 17.42 kg/m.  General Appearance: Well Groomed  Patent attorneyye Contact::  Absent  Speech:  Normal Rate409  Volume:  Normal  Mood:  improved   Affect:  Appropriate and more reactive   Thought Process:  Linear and Descriptions of Associations: Intact  Orientation:  Full (Time, Place, and Person)  Thought Content:  no hallucinations, no delusions  Suicidal Thoughts:  No denies any suicidal or self injurious ideations, no homicidal or violent ideations   Homicidal Thoughts:  No  Memory:  recent and remote grossly intact   Judgement:  Other:  improved   Insight: fair-  improved   Psychomotor Activity:  Normal  Concentration:  Good  Recall:  Good  Fund of Knowledge:Good  Language: Good  Akathisia:  Negative  Handed:  Right  AIMS (if indicated):     Assets:  Communication Skills Desire for Improvement Resilience  Sleep:  Number of Hours: 4.75  Cognition: WNL  ADL's:  Intact   Mental Status Per Nursing Assessment::   On Admission:  Suicidal ideation indicated by patient, Suicide plan, Plan includes specific time, place, or method, Self-harm thoughts, Self-harm behaviors, Intention to act on suicide plan, Belief that plan would result in death  Demographic Factors:  36 year old male, lives with GF, has 2  children.  Loss Factors: Homelessness, relapse.    Historical Factors: History of Mood Disorder, depression. Has a  history of Alcohol, Cocaine Use Disorder.   Risk Reduction Factors:   Responsible for children under 37 years of age, Sense of responsibility to family, Living with another person, especially a relative and Positive coping skills or problem solving skills  Continued Clinical Symptoms:  At this time patient is presenting alert, attentive, mood is improved , affect is appropriate, reactive, no thought disorder, no suicidal or self injurious ideations, no homicidal or violent ideations, no psychotic symptoms, future oriented. At patient's request and in his presence I spoke with his fiance, Leandro ReasonerRoxanne, who confirms that housing will be available later today or tomorrow and that patient and her will be moving in .  Patient in good spirits and expressing relief about above, as he states he has struggled with homelessness for a long period of time. Denies medication side effects.  Cognitive Features That Contribute To Risk:  No gross cognitive deficits noted upon discharge. Is alert , attentive, and oriented x 3   Suicide Risk:  Mild:  Suicidal ideation of limited frequency, intensity, duration, and specificity.  There are no identifiable plans, no associated intent, mild dysphoria and related symptoms, good self-control (both objective and subjective assessment), few other risk factors, and identifiable protective factors, including available and accessible social support.  Follow-up Information    [facility] Follow up.   Specialty:  Behavioral Health Why:  Walk in within 7 days of hospital discharge to be assessed for  outpatient mental health services including: medication management, counseling, support groups. Walk in hours: Mon-Fri 8am-9am. Thank you.  Contact information: [address] [place] [address] [phone]           Plan Of Care/Follow-up recommendations:   Activity:  as tolerated  Diet:  Regular Tests:  NA Other:  See below  Patient is leaving in good spirits  Plans to move in to [employer] housing with his fiance Plans to follow up as above  Encouraged to avoid people, places and situations he associates with drugs or alcohol, to minimize risk of relapse.  Craige Cotta, MD 07/29/2016, 12:18 PM

## 2016-10-28 ENCOUNTER — Emergency Department (HOSPITAL_COMMUNITY)
Admission: EM | Admit: 2016-10-28 | Discharge: 2016-10-28 | Discharge status: Against Medical Advice | Attending: Emergency Medicine | Admitting: Emergency Medicine

## 2016-10-28 ENCOUNTER — Encounter (HOSPITAL_COMMUNITY): Payer: Self-pay

## 2016-10-28 DIAGNOSIS — Z79899 Other long term (current) drug therapy: Secondary | ICD-10-CM | POA: Insufficient documentation

## 2016-10-28 DIAGNOSIS — M542 Cervicalgia: Secondary | ICD-10-CM | POA: Insufficient documentation

## 2016-10-28 DIAGNOSIS — Z5320 Procedure and treatment not carried out because of patient's decision for unspecified reasons: Secondary | ICD-10-CM | POA: Insufficient documentation

## 2016-10-28 DIAGNOSIS — S0181XA Laceration without foreign body of other part of head, initial encounter: Secondary | ICD-10-CM | POA: Insufficient documentation

## 2016-10-28 DIAGNOSIS — Y929 Unspecified place or not applicable: Secondary | ICD-10-CM | POA: Insufficient documentation

## 2016-10-28 DIAGNOSIS — F17210 Nicotine dependence, cigarettes, uncomplicated: Secondary | ICD-10-CM | POA: Insufficient documentation

## 2016-10-28 DIAGNOSIS — Y939 Activity, unspecified: Secondary | ICD-10-CM | POA: Insufficient documentation

## 2016-10-28 DIAGNOSIS — Y998 Other external cause status: Secondary | ICD-10-CM | POA: Insufficient documentation

## 2016-10-28 DIAGNOSIS — R51 Headache: Secondary | ICD-10-CM | POA: Insufficient documentation

## 2016-10-28 DIAGNOSIS — R55 Syncope and collapse: Secondary | ICD-10-CM | POA: Insufficient documentation

## 2016-10-28 DIAGNOSIS — S1191XA Laceration without foreign body of unspecified part of neck, initial encounter: Secondary | ICD-10-CM | POA: Insufficient documentation

## 2016-10-28 LAB — CBC
HCT: 44 % (ref 39.0–52.0)
Hemoglobin: 14.7 g/dL (ref 13.0–17.0)
MCH: 33.5 pg (ref 26.0–34.0)
MCHC: 33.4 g/dL (ref 30.0–36.0)
MCV: 100.2 fL — AB (ref 78.0–100.0)
PLATELETS: 250 10*3/uL (ref 150–400)
RBC: 4.39 MIL/uL (ref 4.22–5.81)
RDW: 12.6 % (ref 11.5–15.5)
WBC: 7.8 10*3/uL (ref 4.0–10.5)

## 2016-10-28 LAB — BASIC METABOLIC PANEL
Anion gap: 8 (ref 5–15)
BUN: 5 mg/dL — AB (ref 6–20)
CHLORIDE: 110 mmol/L (ref 101–111)
CO2: 23 mmol/L (ref 22–32)
CREATININE: 0.87 mg/dL (ref 0.61–1.24)
Calcium: 7.9 mg/dL — ABNORMAL LOW (ref 8.9–10.3)
GFR calc Af Amer: >60 mL/min (ref >60)
GFR calc non Af Amer: >60 mL/min (ref >60)
GLUCOSE: 108 mg/dL — AB (ref 65–99)
Potassium: 3.4 mmol/L — ABNORMAL LOW (ref 3.5–5.1)
SODIUM: 141 mmol/L (ref 135–145)

## 2016-10-28 LAB — I-STAT TROPONIN, ED: Troponin i, poc: 0 ng/mL (ref 0.00–0.08)

## 2016-10-28 NOTE — ED Notes (Signed)
Patient now very upset and wants to leave.  Will not listen to anyone.  Patient being discharged AMA.
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
## 2016-10-28 NOTE — ED Notes (Signed)
Patient very aggressive to staff and visitors.  States he does not want to stay and wants to leave.  Patient seen by EDP and wants him to stay and have scans done.  Patient seems to be okay with plan, [employer] aware of patient and talked with patient as well.

## 2016-10-28 NOTE — ED Notes (Signed)
Patient discharged AMA.  Ambulatory to the exit with police.  All belongings taken with police.
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
## 2016-11-08 ENCOUNTER — Emergency Department (HOSPITAL_COMMUNITY)
Admission: EM | Admit: 2016-11-08 | Discharge: 2016-11-08 | Disposition: A | Discharge status: Home / Self Care | Attending: Emergency Medicine | Admitting: Emergency Medicine

## 2016-11-08 ENCOUNTER — Emergency Department (HOSPITAL_COMMUNITY)

## 2016-11-08 ENCOUNTER — Encounter (HOSPITAL_COMMUNITY): Payer: Self-pay | Admitting: Emergency Medicine

## 2016-11-08 DIAGNOSIS — M62830 Muscle spasm of back: Secondary | ICD-10-CM

## 2016-11-08 DIAGNOSIS — F17210 Nicotine dependence, cigarettes, uncomplicated: Secondary | ICD-10-CM | POA: Insufficient documentation

## 2016-11-08 MED ORDER — NAPROXEN 500 MG PO TABS: 500.0000 mg | ORAL_TABLET | Freq: Two times a day (BID) | ORAL | 0 refills | Status: DC

## 2016-11-08 MED ORDER — KETOROLAC TROMETHAMINE 15 MG/ML IJ SOLN
15.0000 mg | Freq: Once | INTRAMUSCULAR | Status: AC
Start: 2016-11-08 — End: 2016-11-08
  Administered 2016-11-08: 15 mg via INTRAMUSCULAR
  Filled 2016-11-08: qty 1

## 2016-11-08 MED ORDER — METHOCARBAMOL 500 MG PO TABS: 500.0000 mg | ORAL_TABLET | Freq: Every evening | ORAL | 0 refills | Status: DC | PRN
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
## 2016-11-08 NOTE — ED Notes (Signed)
Discharge instructions reviewed with patient. Patient verbalizes understanding. VSS.   

## 2016-11-08 NOTE — Discharge Instructions (Addendum)
As discussed, the medication prescribed helps with muscle spasm but cannot be taken if driving or operating machinery. Take naproxen twice a day with food and muscle relaxer at night time. Apply heat to the area. Biofreeze, icy/hot creams may provided some relief as well.  Follow up with your primary care provider.
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
## 2017-08-25 ENCOUNTER — Encounter (HOSPITAL_COMMUNITY): Payer: Self-pay | Admitting: *Deleted

## 2017-08-25 ENCOUNTER — Emergency Department (HOSPITAL_COMMUNITY)
Admission: EM | Admit: 2017-08-25 | Discharge: 2017-08-26 | Disposition: A | Discharge status: Other Acute Inpatient Hospital | Payer: Self-pay | Attending: Emergency Medicine | Admitting: Emergency Medicine

## 2017-08-25 DIAGNOSIS — R45851 Suicidal ideations: Secondary | ICD-10-CM | POA: Insufficient documentation

## 2017-08-25 DIAGNOSIS — F319 Bipolar disorder, unspecified: Secondary | ICD-10-CM | POA: Diagnosis present

## 2017-08-25 DIAGNOSIS — F17210 Nicotine dependence, cigarettes, uncomplicated: Secondary | ICD-10-CM | POA: Insufficient documentation

## 2017-08-25 DIAGNOSIS — F1510 Other stimulant abuse, uncomplicated: Secondary | ICD-10-CM | POA: Insufficient documentation

## 2017-08-25 LAB — RAPID URINE DRUG SCREEN, HOSP PERFORMED
AMPHETAMINES: POSITIVE — AB
BENZODIAZEPINES: NOT DETECTED
Cocaine: NOT DETECTED
Opiates: NOT DETECTED
Tetrahydrocannabinol: POSITIVE — AB

## 2017-08-25 LAB — COMPREHENSIVE METABOLIC PANEL
ALBUMIN: 3.7 g/dL (ref 3.5–5.0)
ALT: 29 U/L (ref 0–44)
AST: 22 U/L (ref 15–41)
Alkaline Phosphatase: 54 U/L (ref 38–126)
Anion gap: 7 (ref 5–15)
BILIRUBIN TOTAL: 0.5 mg/dL (ref 0.3–1.2)
BUN: 11 mg/dL (ref 6–20)
CO2: 28 mmol/L (ref 22–32)
CREATININE: 0.76 mg/dL (ref 0.61–1.24)
Calcium: 8.8 mg/dL — ABNORMAL LOW (ref 8.9–10.3)
Chloride: 105 mmol/L (ref 98–111)
GFR calc Af Amer: >60 mL/min (ref >60)
GFR calc non Af Amer: >60 mL/min (ref >60)
GLUCOSE: 89 mg/dL (ref 70–99)
POTASSIUM: 4 mmol/L (ref 3.5–5.1)
Sodium: 140 mmol/L (ref 135–145)
TOTAL PROTEIN: 7 g/dL (ref 6.5–8.1)

## 2017-08-25 LAB — CBC
HEMATOCRIT: 41.7 % (ref 39.0–52.0)
Hemoglobin: 14.6 g/dL (ref 13.0–17.0)
MCH: 33.5 pg (ref 26.0–34.0)
MCHC: 35 g/dL (ref 30.0–36.0)
MCV: 95.6 fL (ref 78.0–100.0)
Platelets: 282 10*3/uL (ref 150–400)
RBC: 4.36 MIL/uL (ref 4.22–5.81)
RDW: 12.4 % (ref 11.5–15.5)
WBC: 5.9 10*3/uL (ref 4.0–10.5)

## 2017-08-25 LAB — ETHANOL: Alcohol, Ethyl (B): <10 mg/dL (ref <10)

## 2017-08-25 LAB — SALICYLATE LEVEL: Salicylate Lvl: <7 mg/dL (ref 2.8–30.0)

## 2017-08-25 LAB — ACETAMINOPHEN LEVEL: Acetaminophen (Tylenol), Serum: <10 ug/mL — ABNORMAL LOW (ref 10–30)

## 2017-08-25 MED ORDER — ACETAMINOPHEN 325 MG PO TABS: 650.0000 mg | ORAL_TABLET | ORAL | Status: DC | PRN

## 2017-08-25 MED ORDER — ZOLPIDEM TARTRATE 5 MG PO TABS: 5.0000 mg | ORAL_TABLET | Freq: Every evening | ORAL | Status: DC | PRN

## 2017-08-25 MED ORDER — ONDANSETRON HCL 4 MG PO TABS: 4.0000 mg | ORAL_TABLET | Freq: Three times a day (TID) | ORAL | Status: DC | PRN

## 2017-08-25 MED ORDER — NICOTINE 21 MG/24HR TD PT24: 21.0000 mg | MEDICATED_PATCH | Freq: Every day | TRANSDERMAL | Status: DC

## 2017-08-25 NOTE — ED Notes (Signed)
Bed: [facility] Expected date:  Expected time:  Means of arrival:  Comments: 

## 2017-08-25 NOTE — ED Notes (Signed)
Pt resting answers questions approprietly, states "I just want to go home". I will continue to monitor.
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
## 2017-08-25 NOTE — ED Notes (Signed)
Bed: WA31 Expected date:  Expected time:  Means of arrival:  Comments: 

## 2017-08-26 ENCOUNTER — Encounter (HOSPITAL_COMMUNITY): Payer: Self-pay | Admitting: *Deleted

## 2017-08-26 ENCOUNTER — Inpatient Hospital Stay (HOSPITAL_COMMUNITY)
Admission: AD | Admit: 2017-08-26 | Discharge: 2017-08-27 | DRG: 885 | Disposition: A | Discharge status: Home / Self Care | Payer: Federal, State, Local not specified - Other | Source: Intra-hospital | Attending: Psychiatry | Admitting: Psychiatry

## 2017-08-26 ENCOUNTER — Other Ambulatory Visit: Payer: Self-pay

## 2017-08-26 DIAGNOSIS — F3163 Bipolar disorder, current episode mixed, severe, without psychotic features: Principal | ICD-10-CM | POA: Diagnosis present

## 2017-08-26 DIAGNOSIS — Z62810 Personal history of physical and sexual abuse in childhood: Secondary | ICD-10-CM | POA: Diagnosis present

## 2017-08-26 DIAGNOSIS — Z653 Problems related to other legal circumstances: Secondary | ICD-10-CM

## 2017-08-26 DIAGNOSIS — Z915 Personal history of self-harm: Secondary | ICD-10-CM

## 2017-08-26 DIAGNOSIS — F17210 Nicotine dependence, cigarettes, uncomplicated: Secondary | ICD-10-CM | POA: Diagnosis present

## 2017-08-26 DIAGNOSIS — Z9114 Patient's other noncompliance with medication regimen: Secondary | ICD-10-CM

## 2017-08-26 DIAGNOSIS — Z599 Problem related to housing and economic circumstances, unspecified: Secondary | ICD-10-CM | POA: Diagnosis not present

## 2017-08-26 DIAGNOSIS — Z818 Family history of other mental and behavioral disorders: Secondary | ICD-10-CM

## 2017-08-26 MED ORDER — NICOTINE 21 MG/24HR TD PT24
21.0000 mg | MEDICATED_PATCH | Freq: Every day | TRANSDERMAL | Status: DC
Start: 2017-08-26 — End: 2017-08-27
  Filled 2017-08-26 (×4): qty 1

## 2017-08-26 MED ORDER — SERTRALINE HCL 50 MG PO TABS
50.0000 mg | ORAL_TABLET | Freq: Every day | ORAL | Status: DC
  Filled 2017-08-26 (×2): qty 1
  Filled 2017-08-26 (×2): qty 7

## 2017-08-26 MED ORDER — MAGNESIUM HYDROXIDE 400 MG/5ML PO SUSP: 30.0000 mL | Freq: Every day | ORAL | Status: DC | PRN

## 2017-08-26 MED ORDER — HYDROXYZINE HCL 25 MG PO TABS
ORAL_TABLET | ORAL | Status: AC
  Administered 2017-08-26: 25 mg via ORAL
  Filled 2017-08-26: qty 1

## 2017-08-26 MED ORDER — ALUM & MAG HYDROXIDE-SIMETH 200-200-20 MG/5ML PO SUSP: 30.0000 mL | ORAL | Status: DC | PRN

## 2017-08-26 MED ORDER — HYDROXYZINE HCL 25 MG PO TABS
25.0000 mg | ORAL_TABLET | Freq: Four times a day (QID) | ORAL | Status: DC | PRN
  Administered 2017-08-26: 25 mg via ORAL
  Filled 2017-08-26: qty 10

## 2017-08-26 MED ORDER — ACETAMINOPHEN 325 MG PO TABS: 650.0000 mg | ORAL_TABLET | ORAL | Status: DC | PRN

## 2017-08-26 MED ORDER — ONDANSETRON HCL 4 MG PO TABS: 4.0000 mg | ORAL_TABLET | Freq: Three times a day (TID) | ORAL | Status: DC | PRN

## 2017-08-26 MED ORDER — ARIPIPRAZOLE 2 MG PO TABS
2.0000 mg | ORAL_TABLET | Freq: Every day | ORAL | Status: DC
  Filled 2017-08-26: qty 7
  Filled 2017-08-26 (×2): qty 1
  Filled 2017-08-26: qty 7
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
## 2017-08-26 NOTE — [facility] Suicide Risk Assessment (Signed)
[facility] INPATIENT:  Family/Significant Other Suicide Prevention Education  Suicide Prevention Education:  Education Completed; Jearld PiesRoxanne Silva [phone],  (name of family member/significant other) has been identified by the patient as the family member/significant other with whom the patient will be residing, and identified as the person(s) who will aid the patient in the event of a mental health crisis (suicidal ideations/suicide attempt).  With written consent from the patient, the family member/significant other has been provided the following suicide prevention education, prior to the and/or following the discharge of the patient.  The suicide prevention education provided includes the following:  Suicide risk factors  Suicide prevention and interventions  National Suicide Hotline telephone number  [facility] assessment telephone number  [facility] Emergency Assistance 911  [facility] and/or Residential Mobile Crisis Unit telephone number  Request made of family/significant other to:  Remove weapons (e.g., guns, rifles, knives), all items previously/currently identified as safety concern.    Remove drugs/medications (over-the-counter, prescriptions, illicit drugs), all items previously/currently identified as a safety concern.  The family member/significant other verbalizes understanding of the suicide prevention education information provided.  The family member/significant other agrees to remove the items of safety concern listed above.  Carloyn JaegerMareida J Grossman-Orr 08/26/2017, 5:09 PM

## 2017-08-26 NOTE — ED Provider Notes (Signed)
[facility], but they do wish first opinion paperwork to be filled out.  I have talked with the patient and he actually does not appear overtly depressed and denies suicidal thoughts.  He does admit to having made a statement that people would be better off if he were dead, but states that he was not being serious.  He denies constitutional symptoms of depression such as crying spells, early morning awakening, anhedonia.  He denies hallucinations.  However, TTS evaluation was that he did need inpatient care.  He does have prior behavioral health hospital admission for depression.  At this point, I have completed the first opinion paperwork and will defer to psychiatry when he is safe for discharge.   Dione BoozeGlick, Eyleen Rawlinson, MD 08/26/17 [phone]

## 2017-08-26 NOTE — ED Notes (Signed)
ED TO INPATIENT HANDOFF REPORT  Name/Age/Gender Cody Gallagher 37 y.o. male  Code Status    Code Status Orders  (From admission, onward)        Start     Ordered   08/25/17 2053  Full code  Continuous     08/25/17 2053    Code Status History    Date Active Date Inactive Code Status Order ID Comments User Context   07/21/2016 2339 07/29/2016 1716 Full Code [id number]  Migdalia Dk, RN Inpatient   07/21/2016 1921 07/21/2016 2228 Full Code [id number]  Margette Fast, MD ED   06/09/2016 1542 06/10/2016 1503 Full Code [id number]  Virgel Manifold, MD ED      Home/SNF/Other Home  Chief Complaint ivc  Level of Care/Admitting Diagnosis ED Disposition    ED Disposition Condition Comment   Transfer to Another Facility  Transfer to [facility]      Medical History Past Medical History:  Diagnosis Date  . Anxiety   . Bipolar 1 disorder ([place])     Allergies No Known Allergies  IV Location/Drains/Wounds Patient Lines/Drains/Airways Status   Active Line/Drains/Airways    Name:   Placement date:   Placement time:   Site:   Days:   Peripheral IV 10/28/16 Right Antecubital   10/28/16    -    Antecubital   302          Labs/Imaging Results for orders placed or performed during the hospital encounter of 08/25/17 (from the past 48 hour(s))  Comprehensive metabolic panel     Status: Abnormal   Collection Time: 08/25/17  5:19 PM  Result Value Ref Range   Sodium 140 135 - 145 mmol/L   Potassium 4.0 3.5 - 5.1 mmol/L   Chloride 105 98 - 111 mmol/L    Comment: Please note change in reference range.   CO2 28 22 - 32 mmol/L   Glucose, Bld 89 70 - 99 mg/dL    Comment: Please note change in reference range.   BUN 11 6 - 20 mg/dL    Comment: Please note change in reference range.   Creatinine, Ser 0.76 0.61 - 1.24 mg/dL   Calcium 8.8 (L) 8.9 - 10.3 mg/dL   Total Protein 7.0 6.5 - 8.1 g/dL   Albumin 3.7 3.5 - 5.0 g/dL   AST 22 15 - 41 U/L   ALT 29 0 - 44 U/L    Comment: Please note  change in reference range.   Alkaline Phosphatase 54 38 - 126 U/L   Total Bilirubin 0.5 0.3 - 1.2 mg/dL   GFR calc non Af Amer >60 >60 mL/min   GFR calc Af Amer >60 >60 mL/min    Comment: (NOTE) The eGFR has been calculated using the CKD EPI equation. This calculation has not been validated in all clinical situations. eGFR's persistently <60 mL/min signify possible Chronic Kidney Disease.    Anion gap 7 5 - 15    Comment: Performed at [facility], [place] [address] [place], [place] [address]  Ethanol     Status: None   Collection Time: 08/25/17  5:19 PM  Result Value Ref Range   Alcohol, Ethyl (B) <10 <10 mg/dL    Comment: (NOTE) Lowest detectable limit for serum alcohol is 10 mg/dL. For medical purposes only. Performed at [facility], [place] [address] [place], [place] [address]   Salicylate level     Status: None   Collection Time: 08/25/17  5:19 PM  Result Value Ref Range   Salicylate Lvl <4.0 2.8 - 30.0 mg/dL    Comment: Performed at [facility], [place] [address] [place], [place] [address]  Acetaminophen level     Status: Abnormal   Collection Time: 08/25/17  5:19 PM  Result Value Ref Range   Acetaminophen (Tylenol), Serum <10 (L) 10 - 30 ug/mL    Comment: (NOTE) Therapeutic concentrations vary significantly. A range of 10-30 ug/mL  may be an effective concentration for many patients. However, some  are best treated at concentrations outside of this range. Acetaminophen concentrations >150 ug/mL at 4 hours after ingestion  and >50 ug/mL at 12 hours after ingestion are often associated with  toxic reactions. Performed at [facility], [place] [address], [place], [place] [address]   cbc     Status: None   Collection Time: 08/25/17  5:19 PM  Result Value Ref Range   WBC 5.9 4.0 - 10.5 K/uL   RBC 4.36 4.22 - 5.81 MIL/uL   Hemoglobin 14.6 13.0 - 17.0 g/dL   HCT 41.7 39.0 - 52.0 %   MCV 95.6 78.0 -  100.0 fL   MCH 33.5 26.0 - 34.0 pg   MCHC 35.0 30.0 - 36.0 g/dL   RDW 12.4 11.5 - 15.5 %   Platelets 282 150 - 400 K/uL    Comment: Performed at [facility], [place] [address], [place], [place] [address]  Rapid urine drug screen (hospital performed)     Status: Abnormal   Collection Time: 08/25/17  7:54 PM  Result Value Ref Range   Opiates NONE DETECTED NONE DETECTED   Cocaine NONE DETECTED NONE DETECTED   Benzodiazepines NONE DETECTED NONE DETECTED   Amphetamines POSITIVE (A) NONE DETECTED   Tetrahydrocannabinol POSITIVE (A) NONE DETECTED   Barbiturates (A) NONE DETECTED    Result not available. Reagent lot number recalled by manufacturer.    Comment: Performed at [facility], [place] [address] [place], [place] [address]   No results found.  Pending Labs Unresulted Labs (From admission, onward)   None      Vitals/Pain Today's Vitals   08/25/17 1645 08/25/17 2203 08/26/17 0631  BP: (!) 133/105 124/89 104/75  Pulse: 91 (!) 57 (!) 50  Resp: _0 Temp: 98.9 F (37.2 C) 98.3 F (36.8 C) 98.1 F (36.7 C)  TempSrc: Oral Oral Oral  SpO2: 96% 100% 100%    Isolation Precautions No active isolations  Medications Medications  acetaminophen (TYLENOL) tablet 650 mg (has no administration in time range)  ondansetron (ZOFRAN) tablet 4 mg (has no administration in time range)  nicotine (NICODERM CQ - dosed in mg/24 hours) patch 21 mg (21 mg Transdermal Not Given 08/25/17 2107)    Mobility walks

## 2017-08-26 NOTE — Tx Team (Signed)
Initial Treatment Plan 08/26/2017 12:01 PM Cody GentaAnthony Gallagher [id number]    PATIENT STRESSORS: Financial difficulties Legal issue   PATIENT STRENGTHS: Ability for insight Active sense of humor   PATIENT IDENTIFIED PROBLEMS: MDD  Polysubstance Abuse              " I don't deserve to be mistreated"  " I don't wantto live like this"   DISCHARGE CRITERIA:  Ability to meet basic life and health needs Adequate post-discharge living arrangements  PRELIMINARY DISCHARGE PLAN: Outpatient therapy  PATIENT/FAMILY INVOLVEMENT: This treatment plan has been presented to and reviewed with the patient, Cody Gallagher, and/or family member, .  The patient and family have been given the opportunity to ask questions and make suggestions.  Rich Braveuke, Mykah Shin Lynn, RN 08/26/2017, 12:01 PM

## 2017-08-26 NOTE — Progress Notes (Signed)
Patient ID: Cody Gallagher, male   DOB: 09/27/1980, 37 y.o.   MRN: [id number] D: Patient has been in his room sleeping since beginning of shift. Tried waking up pt  to talk and charge ankle monitor but too sleepy. Pt unable to attend in evening wrap up group.  A: Will continue to assess pt for safety through the night.  R: Patient remains safe on the unit.

## 2017-08-26 NOTE — Progress Notes (Signed)
Patient is a 37 yo caucasian male who [facility] ( he says he was here once before-last year) and is stunned, upset and unclear how and why he was IVC'd. His story as he relays it to this Clinical research associatewriter: He was staying with his wi and they have 6 children. He and she are active methamphetamine abusers. 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 He says his wife has a seizure disorder, they were doing meth, she had seizure " someone called the cops" and he ended iup here. He is upset because his wife is havign an affair with his best friend and that all of his belongings and his clothese are in a duffel bag that he hid in the woods recently ( he was in the process of making plans to leave herand find a different place to live). He is upset because he says he does not understand how and /or why [facility]- he denies making suicidal statemnts. HE says he and his wife do not have a good relationship, that " she's always putting me down...talking down to me and treating me like s---". He says they fight about everything- the kids, money, drugs. He has an ankle monitor on his left ankle and he gives this Copywriter the charger and it is placed in 300 hall med room. HE will not disclose why he is wearing an ankle monitor. He contracts with this Clinical research associatewriter to not hurt himself and he is oriented to the unit.
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
## 2017-08-26 NOTE — ED Notes (Signed)
[facility] and [facility].Called the after hours [employer] number at [phone] and made them aware, I will continue to monitor.

## 2017-08-27 MED ORDER — SERTRALINE HCL 50 MG PO TABS: 50.0000 mg | ORAL_TABLET | Freq: Every day | ORAL | 0 refills | Status: AC

## 2017-08-27 MED ORDER — HYDROXYZINE HCL 25 MG PO TABS: 25.0000 mg | ORAL_TABLET | Freq: Four times a day (QID) | ORAL | 0 refills | Status: AC | PRN

## 2017-08-27 MED ORDER — ARIPIPRAZOLE 2 MG PO TABS: 2.0000 mg | ORAL_TABLET | Freq: Every day | ORAL | 0 refills | Status: AC

## 2017-08-27 MED ORDER — NICOTINE 21 MG/24HR TD PT24: 21.0000 mg | MEDICATED_PATCH | Freq: Every day | TRANSDERMAL | 0 refills | Status: AC
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
## 2017-08-27 NOTE — [facility] Suicide Risk Assessment (Signed)
[facility]   Principal Problem:  Substance Use Disorder, Substance Induced Mood Disorder  Discharge Diagnoses:  Patient Active Problem List   Diagnosis Date Noted  . Bipolar disorder, curr episode mixed, severe, w/o psychotic features (HCC) [F31.63] 08/26/2017  . Bipolar 1 disorder (HCC) [F31.9] 07/21/2016  . Cocaine abuse with cocaine-induced mood disorder [facility](HCC) [F14.14] 06/10/2016    Total Time spent with patient: 30 minutes  Musculoskeletal: Strength & Muscle Tone: within normal limits Gait & Station: normal Patient leans: N/A  Psychiatric Specialty Exam: ROS denies chest pain, no  shortness of breath, no nausea, no vomiting, no fever, no chills  Blood pressure (!) 132/96, pulse 79, temperature 97.8 F (36.6 C), temperature source Oral, resp. rate 18, height 5\' 11"  (1.803 m), weight 63.5 kg (140 lb), SpO2 100 %.Body mass index is 19.53 kg/m.  General Appearance: Fairly Groomed  Patent attorneyye Contact::  Good  Speech:  Normal Rate409  Volume:  Normal  Mood:  Reports "my mood is okay", denies feeling depressed   Affect:  Vaguely anxious, tends to improve during session, smiles at times appropriately, not irritable or agitated at present  Thought Process:  Linear and Descriptions of Associations: Intact  Orientation:  Full (Time, Place, and Person)  Thought Content:  Denies hallucinations, no delusions, not internally preoccupied  Suicidal Thoughts:  No-denies suicidal ideations, denies any self-injurious ideations, presents future oriented, denies homicidal or violent ideations, specifically also denies any violent or homicidal ideations towards wife  Homicidal Thoughts:  No  Memory:  Recent and remote grossly intact  Judgement:  Fair-improving  Insight:  Fair  Psychomotor Activity:  Normal  Concentration:  Good  Recall:  Good  Fund of Knowledge:Good  Language: Good  Akathisia:  Negative  Handed:  Right  AIMS (if indicated):     Assets:  Desire for  Improvement Resilience  Sleep:     Cognition: WNL  ADL's:  Intact   Mental Status Per Nursing Assessment::   On Admission:  Suicidal ideation indicated by patient, Thoughts of violence towards others  Demographic Factors:  37 year old male, married, employed  Loss Factors: Marital stressors, legal issues, substance abuse  Historical Factors: History of mood disorder, history of substance abuse (stimulant abuse, cannabis). History of prior psychiatric admission in 2018.  Risk Reduction Factors:   Sense of responsibility to family, Employed, Living with another person, especially a relative and Positive coping skills or problem solving skills  Continued Clinical Symptoms:  Patient presents alert, attentive, calm, cooperative, denies depression, presents vaguely anxious but without any psychomotor agitation, describes mood as "okay", minimizes depression, affect is vaguely anxious but reactive, smiles appropriately at times, there is no thought disorder, he is not suicidal and presents future oriented, focusing on needing to return to work soon in orderbe able to pay for rent.  Denies homicidal ideations, specifically denies any homicidal ideations towards his wife, states that" I love hurt, I would never hurt her" states they might consider temporary separation as an option "to give each other space".  No hallucinations, no delusions. Patient continues to report that he had no suicidal ideations prior to admission and that statements he made were simply in the context of "venting"regarding stressors, but denies having made any suicidal statements.  As above, he is future oriented, stating he needs to return to work and get paid in order to avoid eviction or falling behind on bills. No agitated or overtly disruptive behaviors on unit. Denies medication side effects. Social worker has obtained  collateral information from patient's wife, who is in agreement with discharge. We have reviewed  medication side effects and importance of sobriety/abstinence as integral goals of treatment, have reviewed negative impact substance abuse will likely have on mood and a general level of functioning.  Cognitive Features That Contribute To Risk:  No gross cognitive deficits noted upon discharge. Is alert , attentive, and oriented x 3   Suicide Risk:  Mild:  Suicidal ideation of limited frequency, intensity, duration, and specificity.  There are no identifiable plans, no associated intent, mild dysphoria and related symptoms, good self-control (both objective and subjective assessment), few other risk factors, and identifiable protective factors, including available and accessible social support.  Follow-up Information    [facility] Follow up.   Why:  To get reestablished in medication management, please go to the [facility] any day Monday-Friday 8:00AM-3:00PM for a new assessment. Contact information: [address] [place] [address] [phone]           Plan Of Care/Follow-up recommendations:  Activity:  As tolerated Diet:  Regular Tests:  NA Other:  See below  At this time patient is requesting discharge and there are no current grounds for ongoing involuntary commitment Plans to follow-up as above.  Craige Cotta, MD 08/27/2017, 12:05 PM

## 2017-08-27 NOTE — [facility] Group Notes (Signed)
[facility] LCSW Group Therapy Note  Date/Time:  08/27/2017 9:00-10:00 or 10:00-11:00AM  Type of Therapy and Topic:  Group Therapy:  Healthy and Unhealthy Supports  Participation Level:  Did Not Attend   Description of Group:  Patients in this group were introduced to the idea of adding a variety of healthy supports to address the various needs in their lives.Patients discussed what additional healthy supports could be helpful in their recovery and wellness after discharge in order to prevent future hospitalizations.   An emphasis was placed on using counselor, doctor, therapy groups, 12-step groups, and problem-specific support groups to expand supports.  They also worked as a group on developing a specific plan for several patients to deal with unhealthy supports through boundary-setting, psychoeducation with loved ones, and even termination of relationships.   Therapeutic Goals:   1)  discuss importance of adding supports to stay well once out of the hospital  2)  compare healthy versus unhealthy supports and identify some examples of each  3)  generate ideas and descriptions of healthy supports that can be added  4)  offer mutual support about how to address unhealthy supports  5)  encourage active participation in and adherence to discharge plan    Summary of Patient Progress:  The patient  did not attend group.  Therapeutic Modalities:   Motivational Interviewing Brief Solution-Focused Therapy  Evorn Gongonnie D. Makaylah Oddo, LCSW

## 2017-08-27 NOTE — Plan of Care (Signed)
  Problem: Education: Goal: Emotional status will improve Outcome: Progressing   

## 2017-08-27 NOTE — Progress Notes (Signed)
Pt refused am meds, said " they said I could go home today..when are you going to get me the hell outta here?" . Pt is antsy, he cannot stand still. HE is impatient. He is irritable and says " I don't want any of that.9 he is pointing to med samples and dc instructions to be given to him as he walks out of the hosptial. .I just wanna get home".   A He refused to com[plete his suicide safety plan as well as take his dialy medications.   R Safety is in  placel DC instructions given to pt per dc protocol. Pt refused to complete his daily assessment but he is willing to contract for safety. Pt is given cc d instructions ( SRA, AVS and transition record). All belongings returned to pt and pt given med samples as well as prescritpions. Pt exited bldg.

## 2018-08-09 ENCOUNTER — Emergency Department (HOSPITAL_COMMUNITY)
Admission: EM | Admit: 2018-08-09 | Discharge: 2018-08-09 | Disposition: A | Discharge status: Home / Self Care | Payer: Self-pay

## 2018-08-09 ENCOUNTER — Other Ambulatory Visit: Payer: Self-pay

## 2018-08-09 NOTE — ED Notes (Signed)
Bed: [facility] Expected date:  Expected time:  Means of arrival:  Comments: 

## 2019-05-19 ENCOUNTER — Encounter (HOSPITAL_COMMUNITY): Payer: Self-pay | Admitting: Emergency Medicine

## 2019-05-19 ENCOUNTER — Emergency Department (HOSPITAL_COMMUNITY): Payer: Self-pay

## 2019-05-19 ENCOUNTER — Emergency Department (HOSPITAL_COMMUNITY)
Admission: EM | Admit: 2019-05-19 | Discharge: 2019-05-19 | Disposition: A | Discharge status: Home / Self Care | Payer: Self-pay | Attending: Emergency Medicine | Admitting: Emergency Medicine

## 2019-05-19 DIAGNOSIS — F17210 Nicotine dependence, cigarettes, uncomplicated: Secondary | ICD-10-CM | POA: Insufficient documentation

## 2019-05-19 DIAGNOSIS — Y9289 Other specified places as the place of occurrence of the external cause: Secondary | ICD-10-CM | POA: Insufficient documentation

## 2019-05-19 DIAGNOSIS — Z23 Encounter for immunization: Secondary | ICD-10-CM | POA: Insufficient documentation

## 2019-05-19 DIAGNOSIS — S0181XA Laceration without foreign body of other part of head, initial encounter: Secondary | ICD-10-CM | POA: Insufficient documentation

## 2019-05-19 DIAGNOSIS — Y999 Unspecified external cause status: Secondary | ICD-10-CM | POA: Insufficient documentation

## 2019-05-19 DIAGNOSIS — Z79899 Other long term (current) drug therapy: Secondary | ICD-10-CM | POA: Insufficient documentation

## 2019-05-19 DIAGNOSIS — S01511A Laceration without foreign body of lip, initial encounter: Secondary | ICD-10-CM | POA: Insufficient documentation

## 2019-05-19 DIAGNOSIS — Y9389 Activity, other specified: Secondary | ICD-10-CM | POA: Insufficient documentation

## 2019-05-19 MED ORDER — LIDOCAINE HCL (PF) 1 % IJ SOLN
5.0000 mL | Freq: Once | INTRAMUSCULAR | Status: AC
  Administered 2019-05-19: 5 mL
  Filled 2019-05-19: qty 30

## 2019-05-19 MED ORDER — HYDROCODONE-ACETAMINOPHEN 5-325 MG PO TABS
1.0000 | ORAL_TABLET | Freq: Once | ORAL | Status: AC
  Administered 2019-05-19: 1 via ORAL
  Filled 2019-05-19: qty 1

## 2019-05-19 MED ORDER — TETANUS-DIPHTH-ACELL PERTUSSIS 5-2.5-18.5 LF-MCG/0.5 IM SUSP
0.5000 mL | Freq: Once | INTRAMUSCULAR | Status: AC
  Administered 2019-05-19: 15:00:00 0.5 mL via INTRAMUSCULAR
  Filled 2019-05-19: qty 0.5

## 2019-05-19 NOTE — ED Notes (Addendum)
Disregard click off for CT Head Wo Contrast, and maxillofacial, accidental click.
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
## 2019-05-19 NOTE — ED Notes (Signed)
PD at bedside.

## 2019-05-19 NOTE — Discharge Instructions (Addendum)
Have the stitches taken out in around 5 days. 

## 2019-05-19 NOTE — ED Notes (Signed)
Patient left without receiving d/c vitals or papers. Patient last seen alert and ambulatory
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
## 2019-05-19 NOTE — ED Provider Notes (Signed)
..Laceration Repair  Date/Time: 05/19/2019 4:48 PM Performed by: Lorelee New, PA-C Authorized by: Lorelee New, PA-C   Consent:    Consent obtained:  Verbal   Consent given by:  Patient   Risks discussed:  Infection, need for additional repair, pain, poor cosmetic result, poor wound healing, nerve damage, tendon damage, retained foreign body and vascular damage   Alternatives discussed:  No treatment and delayed treatment Universal protocol:    Procedure explained and questions answered to patient or proxy's satisfaction: yes     Relevant documents present and verified: yes     Test results available and properly labeled: yes     Imaging studies available: yes     Required blood products, implants, devices, and special equipment available: yes     Site/side marked: yes     Immediately prior to procedure, a time out was called: yes     Patient identity confirmed:  Verbally with patient Anesthesia (see MAR for exact dosages):    Anesthesia method:  Local infiltration   Local anesthetic:  Lidocaine 1% w/o epi Laceration details:    Location:  Face   Face location:  R eyebrow   Length (cm):  3 Repair type:    Repair type:  Simple Pre-procedure details:    Preparation:  Patient was prepped and draped in usual sterile fashion Exploration:    Hemostasis achieved with:  Direct pressure   Wound exploration: wound explored through full range of motion   Treatment:    Area cleansed with:  Saline   Amount of cleaning:  Extensive   Irrigation solution:  Sterile saline   Irrigation volume:  500 mL   Irrigation method:  Pressure wash   Visualized foreign bodies/material removed: no   Skin repair:    Repair method:  Sutures   Suture size:  5-0   Suture material:  Plain gut   Suture technique:  Simple interrupted   Number of sutures:  3 Post-procedure details:    Dressing:  Sterile dressing   Patient tolerance of procedure:  Tolerated well, no immediate  complications .Marland KitchenLaceration Repair  Date/Time: 05/19/2019 4:49 PM Performed by: Lorelee New, PA-C Authorized by: Lorelee New, PA-C   Consent:    Consent obtained:  Verbal   Consent given by:  Patient   Risks discussed:  Infection, need for additional repair, pain, poor cosmetic result, poor wound healing, retained foreign body, vascular damage and nerve damage   Alternatives discussed:  No treatment and delayed treatment Universal protocol:    Procedure explained and questions answered to patient or proxy's satisfaction: yes     Relevant documents present and verified: yes     Test results available and properly labeled: yes     Imaging studies available: yes     Required blood products, implants, devices, and special equipment available: yes     Site/side marked: yes     Immediately prior to procedure, a time out was called: yes     Patient identity confirmed:  Verbally with patient Anesthesia (see MAR for exact dosages):    Anesthesia method:  Local infiltration   Local anesthetic:  Lidocaine 1% w/o epi Laceration details:    Location:  Lip   Lip location:  Upper lip, full thickness   Vermilion border involved: no     Height of lip laceration:  More than half vertical height Repair type:    Repair type:  Intermediate Pre-procedure details:    Preparation:  Patient was  prepped and draped in usual sterile fashion Exploration:    Hemostasis achieved with:  Direct pressure Treatment:    Area cleansed with:  Saline   Amount of cleaning:  Extensive   Irrigation solution:  Sterile saline   Irrigation volume:  1000 mL   Irrigation method:  Pressure wash Skin repair:    Repair method:  Sutures   Suture size:  5-0   Suture material:  Plain gut   Suture technique:  Simple interrupted   Number of sutures:  3       Corena Herter, PA-C 05/19/19 Chardon, Nathan, MD 05/19/19 2352

## 2019-05-22 ENCOUNTER — Emergency Department (HOSPITAL_COMMUNITY): Payer: Self-pay

## 2019-05-22 ENCOUNTER — Encounter (HOSPITAL_COMMUNITY): Payer: Self-pay | Admitting: Student

## 2019-05-22 ENCOUNTER — Emergency Department (HOSPITAL_COMMUNITY)
Admission: EM | Admit: 2019-05-22 | Discharge: 2019-05-22 | Disposition: A | Discharge status: Home / Self Care | Payer: Self-pay | Attending: Emergency Medicine | Admitting: Emergency Medicine

## 2019-05-22 ENCOUNTER — Ambulatory Visit (HOSPITAL_COMMUNITY)
Admission: RE | Admit: 2019-05-22 | Discharge: 2019-05-22 | Disposition: A | Discharge status: Home / Self Care | Payer: Self-pay | Attending: Psychiatry | Admitting: Psychiatry

## 2019-05-22 ENCOUNTER — Other Ambulatory Visit: Payer: Self-pay

## 2019-05-22 DIAGNOSIS — Z79899 Other long term (current) drug therapy: Secondary | ICD-10-CM | POA: Insufficient documentation

## 2019-05-22 DIAGNOSIS — L0390 Cellulitis, unspecified: Secondary | ICD-10-CM

## 2019-05-22 DIAGNOSIS — F17210 Nicotine dependence, cigarettes, uncomplicated: Secondary | ICD-10-CM | POA: Insufficient documentation

## 2019-05-22 DIAGNOSIS — F319 Bipolar disorder, unspecified: Secondary | ICD-10-CM | POA: Insufficient documentation

## 2019-05-22 DIAGNOSIS — L03031 Cellulitis of right toe: Secondary | ICD-10-CM | POA: Insufficient documentation

## 2019-05-22 MED ORDER — CEPHALEXIN 500 MG PO CAPS: 500.0000 mg | ORAL_CAPSULE | Freq: Four times a day (QID) | ORAL | 0 refills | Status: AC

## 2019-05-22 MED ORDER — OXYCODONE-ACETAMINOPHEN 5-325 MG PO TABS
1.0000 | ORAL_TABLET | Freq: Once | ORAL | Status: AC
  Administered 2019-05-22: 1 via ORAL
  Filled 2019-05-22: qty 1

## 2019-05-22 NOTE — ED Notes (Signed)
Patient to xray.

## 2019-05-22 NOTE — H&P (Signed)
Behavioral Health Medical Screening Exam  Cody Gallagher is an 39 y.o. male with history of bipolar disorder and methamphetamine use disorder. He has been incarcerated for the last five months and was released Sunday. He got into an argument with his wife related to her cheating and reports he has been bingeing with meth for the last several days since his release. He stopped taking prescribed Zyprexa and Prozac on Sunday with meth binge. He reports depressed mood related to conflict with his wife and "having to go back to the streets." He reports anger at his wife but denies homicidal plans or intent to wife. He initially reports suicidal thoughts to overdose on drugs but then is future-oriented on assessment, with desire to enter a long-term program for drug use. He states he presented to the hospital for shelter due to not wanting to be homeless and using drugs. Patient appears to be presenting to hospital for issues of secondary gain related to homelessness. He shows no visible signs of paranoia, mood instability, or responding to internal stimuli. He denies AVH. He is agreeable to referrals to rehab programs.  Total Time spent with patient: 15 minutes  Psychiatric Specialty Exam: Physical Exam  Nursing note and vitals reviewed. Constitutional: He is oriented to person, place, and time. He appears well-developed and well-nourished.  Cardiovascular: Normal rate.  Respiratory: Effort normal.  Neurological: He is alert and oriented to person, place, and time.    Review of Systems  Constitutional: Negative.   Respiratory: Negative for cough and shortness of breath.   Gastrointestinal: Negative for nausea and vomiting.  Psychiatric/Behavioral: Positive for dysphoric mood, sleep disturbance and suicidal ideas. Negative for agitation, behavioral problems, confusion, hallucinations and self-injury. The patient is not nervous/anxious and is not hyperactive.     Blood pressure 124/93. Heart rate 101.  Temperature 98.0. Heart rate 101. SpO2 99.  General Appearance: Casual  Eye Contact:  Good  Speech:  Normal Rate  Volume:  Normal  Mood:  Depressed  Affect:  Non-Congruent  Thought Process:  Coherent  Orientation:  Full (Time, Place, and Person)  Thought Content:  Logical  Suicidal Thoughts:  No  Homicidal Thoughts:  No  Memory:  Immediate;   Good Recent;   Good Remote;   Good  Judgement:  Intact  Insight:  Fair  Psychomotor Activity:  Decreased  Concentration: Concentration: Good and Attention Span: Good  Recall:  Good  Fund of Knowledge:Fair  Language: Good  Akathisia:  No  Handed:  Right  AIMS (if indicated):     Assets:  Communication Skills Desire for Improvement Leisure Time Resilience  Sleep:       Musculoskeletal: Strength & Muscle Tone: within normal limits Gait & Station: normal Patient leans: N/A  Recommendations:  Based on my evaluation the patient appears to have an emergency medical condition for which I recommend the patient be transferred to the emergency department for further evaluation. Patient with edema, erythema, and reports of pain in right foot concerning for possible cellulitis- medical evaluation recommended. Patient does not meet criteria for inpatient psychiatric hospitalization at this time. He is provided with resources for rehab for substance use disorder.  Aldean Baker, NP 05/22/2019, 7:57 AM

## 2019-05-22 NOTE — Discharge Instructions (Signed)
I recommend taking antibiotic as prescribed for your foot.  Take pain medicine as needed.  Return to ER if pain is worsening, develop fever or redness worsens, or if you have issues with ambulation.

## 2019-05-22 NOTE — ED Notes (Signed)
Patient back from x-ray 
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
## 2019-08-27 ENCOUNTER — Other Ambulatory Visit: Payer: Self-pay

## 2019-08-27 ENCOUNTER — Emergency Department (HOSPITAL_COMMUNITY): Admission: EM | Admit: 2019-08-27 | Discharge: 2019-08-27 | Discharge status: Against Medical Advice | Payer: Self-pay

## 2019-09-12 ENCOUNTER — Encounter (HOSPITAL_COMMUNITY): Payer: Self-pay

## 2019-09-12 ENCOUNTER — Emergency Department (HOSPITAL_COMMUNITY)
Admission: EM | Admit: 2019-09-12 | Discharge: 2019-09-12 | Disposition: A | Discharge status: Home / Self Care | Payer: Self-pay | Attending: Emergency Medicine | Admitting: Emergency Medicine

## 2019-09-12 ENCOUNTER — Other Ambulatory Visit: Payer: Self-pay

## 2019-09-12 DIAGNOSIS — F17210 Nicotine dependence, cigarettes, uncomplicated: Secondary | ICD-10-CM | POA: Insufficient documentation

## 2019-09-12 DIAGNOSIS — K0889 Other specified disorders of teeth and supporting structures: Secondary | ICD-10-CM | POA: Insufficient documentation

## 2019-09-12 MED ORDER — BUPIVACAINE-EPINEPHRINE (PF) 0.5% -1:200000 IJ SOLN
1.8000 mL | Freq: Once | INTRAMUSCULAR | Status: AC
  Administered 2019-09-12: 1.8 mL
  Filled 2019-09-12: qty 1.8

## 2019-09-12 MED ORDER — PENICILLIN V POTASSIUM 500 MG PO TABS: 500.0000 mg | ORAL_TABLET | Freq: Four times a day (QID) | ORAL | 0 refills | Status: AC

## 2019-09-12 MED ORDER — IBUPROFEN 200 MG PO TABS
600.0000 mg | ORAL_TABLET | Freq: Once | ORAL | Status: AC
  Administered 2019-09-12: 600 mg via ORAL
  Filled 2019-09-12: qty 3

## 2019-09-12 MED ORDER — LIDOCAINE VISCOUS HCL 2 % MT SOLN: 15.0000 mL | OROMUCOSAL | 2 refills | Status: AC | PRN

## 2019-09-12 NOTE — Discharge Instructions (Addendum)
  Dental Pain You have been seen today for dental pain. You should follow up with a dentist as soon as possible. This problem will not resolve on its own without the care of a dentist.  Lidocaine liquid: Use the viscous lidocaine for mouth pain. Swish with the lidocaine and spit it out. Do not swallow it. Salt water solution: You should also swish with a homemade salt water solution, twice a day.  Make this solution by mixing 8 ounces of warm water with about half a teaspoon of salt. Antiinflammatory medications: Take 600 mg of ibuprofen every 6 hours or 440 mg (over the counter dose) to 500 mg (prescription dose) of naproxen every 12 hours for the next 3 days. After this time, these medications may be used as needed for pain. Take these medications with food to avoid upset stomach. Choose only one of these medications, do not take them together. Acetaminophen (generic for Tylenol): Should you continue to have additional pain while taking the ibuprofen or naproxen, you may add in acetaminophen as needed. Your daily total maximum amount of acetaminophen from all sources should be limited to 4000mg/day for persons without liver problems, or 2000mg/day for those with liver problems.  Please take all of your antibiotics until finished!   You may develop abdominal discomfort or diarrhea from the antibiotic.  You may help offset this with probiotics which you can buy or get in yogurt. Do not eat or take the probiotics until 2 hours after your antibiotic.   For prescription assistance, may try using prescription discount sites or apps, such as goodrx.com   Dental Resource Guide  [facility] [address], [address] [place], [place] [address] [phone]  [facility] [address] [place], [place] [address] [phone]  [facility] [address] [place], [place] [address] [phone]  [facility] [address], [address] [place], [place] [address] [phone]  [facility] [address] [place], [place] [address] [phone]  [employer] Www.denistry.[employer].edu/patientcare/studentclinics/becomepatient  [employer] [address] [place], [place] [address] [phone]  Website for free, low-income, or sliding scale dental services in [place]: www.freedentalcare.us  To find a dentist in [place] and surrounding areas: www.ncdental.org/for-the-public/find-a-dentist  [employer] http://www.ncdental.org/meetings-events/[place]  [place] Medicaid Dentist https://dma.ncdhhs.gov/find-a-doctor/medicaid-dental-providers  

## 2019-09-12 NOTE — ED Triage Notes (Signed)
Patient c/o left lower dental pain and swelling x 2 days. Patient denies any difficulty swallowing or breathing.
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
## 2019-09-19 ENCOUNTER — Other Ambulatory Visit: Payer: Self-pay

## 2019-09-19 ENCOUNTER — Emergency Department (HOSPITAL_COMMUNITY)
Admission: EM | Admit: 2019-09-19 | Discharge: 2019-09-19 | Disposition: A | Discharge status: Home / Self Care | Payer: Self-pay | Attending: Emergency Medicine | Admitting: Emergency Medicine

## 2019-09-19 ENCOUNTER — Encounter (HOSPITAL_COMMUNITY): Payer: Self-pay | Admitting: Emergency Medicine

## 2019-09-19 ENCOUNTER — Emergency Department (HOSPITAL_COMMUNITY): Payer: Self-pay

## 2019-09-19 DIAGNOSIS — R310 Gross hematuria: Secondary | ICD-10-CM

## 2019-09-19 DIAGNOSIS — R319 Hematuria, unspecified: Secondary | ICD-10-CM | POA: Insufficient documentation

## 2019-09-19 DIAGNOSIS — F17210 Nicotine dependence, cigarettes, uncomplicated: Secondary | ICD-10-CM | POA: Insufficient documentation

## 2019-09-19 LAB — URINALYSIS, ROUTINE W REFLEX MICROSCOPIC
Bacteria, UA: NONE SEEN
Bilirubin Urine: NEGATIVE
Glucose, UA: NEGATIVE mg/dL
Ketones, ur: NEGATIVE mg/dL
Leukocytes,Ua: NEGATIVE
Nitrite: NEGATIVE
Protein, ur: NEGATIVE mg/dL
RBC / HPF: >50 RBC/hpf — ABNORMAL HIGH (ref 0–5)
Specific Gravity, Urine: 1.019 (ref 1.005–1.030)
pH: 6 (ref 5.0–8.0)

## 2019-09-19 LAB — URINE CULTURE: Culture: NO GROWTH

## 2019-09-19 LAB — BASIC METABOLIC PANEL
Anion gap: 7 (ref 5–15)
BUN: 10 mg/dL (ref 6–20)
CO2: 26 mmol/L (ref 22–32)
Calcium: 8.6 mg/dL — ABNORMAL LOW (ref 8.9–10.3)
Chloride: 105 mmol/L (ref 98–111)
Creatinine, Ser: 0.78 mg/dL (ref 0.61–1.24)
GFR calc Af Amer: >60 mL/min (ref >60)
GFR calc non Af Amer: >60 mL/min (ref >60)
Glucose, Bld: 101 mg/dL — ABNORMAL HIGH (ref 70–99)
Potassium: 3.7 mmol/L (ref 3.5–5.1)
Sodium: 138 mmol/L (ref 135–145)

## 2019-09-19 LAB — CBC
HCT: 40.4 % (ref 39.0–52.0)
Hemoglobin: 13.5 g/dL (ref 13.0–17.0)
MCH: 33.3 pg (ref 26.0–34.0)
MCHC: 33.4 g/dL (ref 30.0–36.0)
MCV: 99.5 fL (ref 80.0–100.0)
Platelets: 227 10*3/uL (ref 150–400)
RBC: 4.06 MIL/uL — ABNORMAL LOW (ref 4.22–5.81)
RDW: 13 % (ref 11.5–15.5)
WBC: 6.5 10*3/uL (ref 4.0–10.5)
nRBC: 0 % (ref 0.0–0.2)

## 2019-09-19 NOTE — ED Triage Notes (Signed)
Patient states a month ago he had blood in his urine and it went away. Patient states when he got off of work yesterday it started back up.
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
## 2020-12-12 IMAGING — CT CT RENAL STONE PROTOCOL
2 of 4 series · 16 of 46 positions shown, 18 images · non-contrast
Comparison: None.

CLINICAL DATA: Gross hematuria for 1 month.

EXAM:
CT ABDOMEN AND PELVIS WITHOUT CONTRAST
TECHNIQUE: Multidetector CT imaging of the abdomen and pelvis was performed
following the standard protocol without IV contrast.

[Series 2: axial st · axial · 0.73mm/px · z∈[+1037,+1437]mm · 13 of 90 slices shown, 15 images]
[im 5/90  soft-tissue]
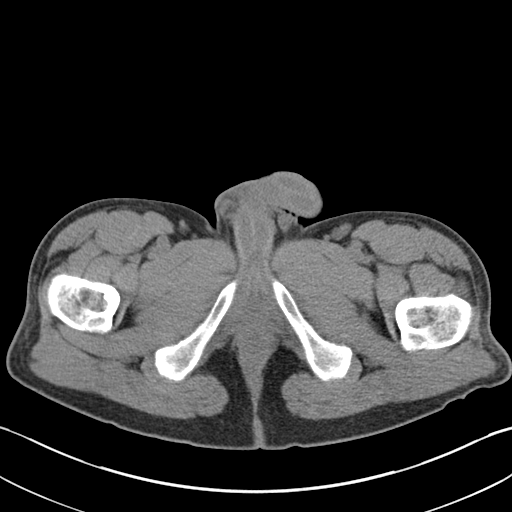
[im 5/90  bone]
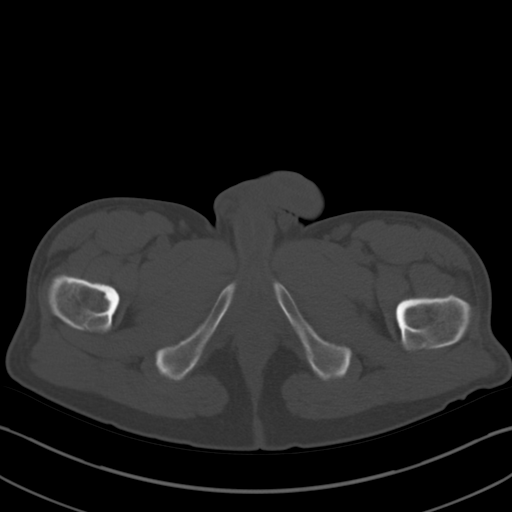
[im 14/90  soft-tissue]
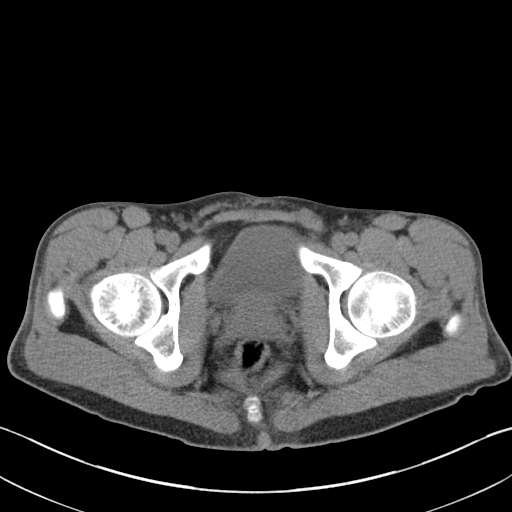
[im 18/90  soft-tissue]
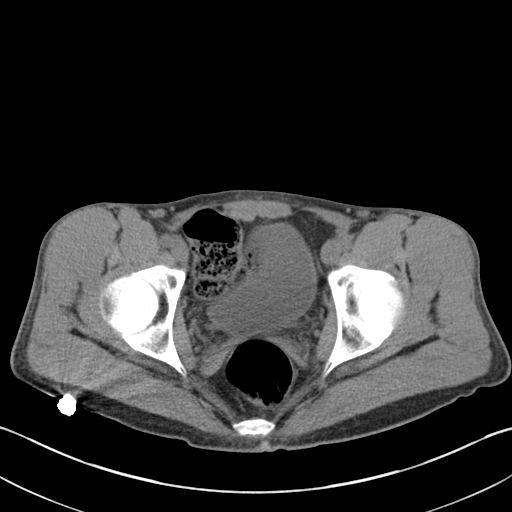
[im 27/90  soft-tissue]
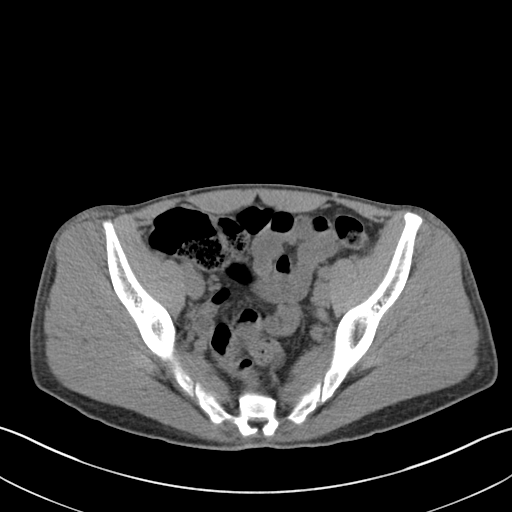
[im 32/90  soft-tissue]
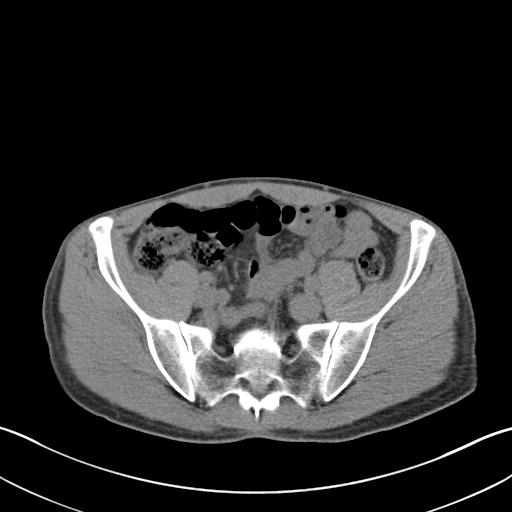
[im 41/90  soft-tissue]
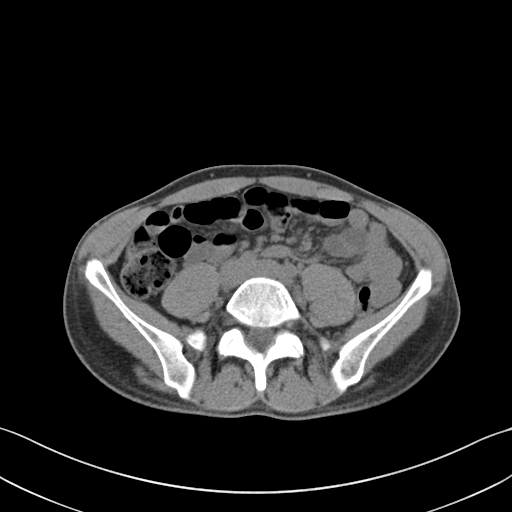
[im 45/90  soft-tissue]
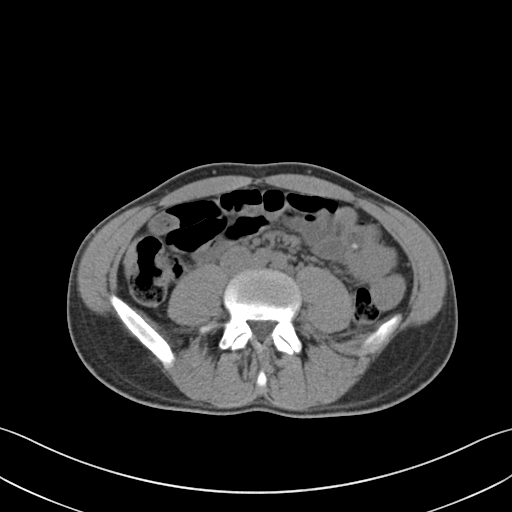
[im 49/90  soft-tissue]
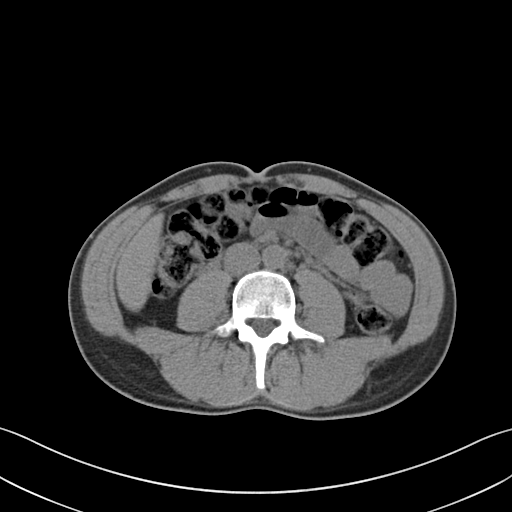
[im 58/90  soft-tissue]
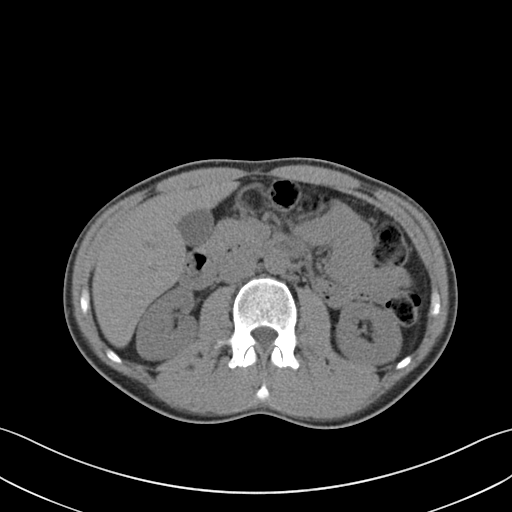
[im 58/90  bone]
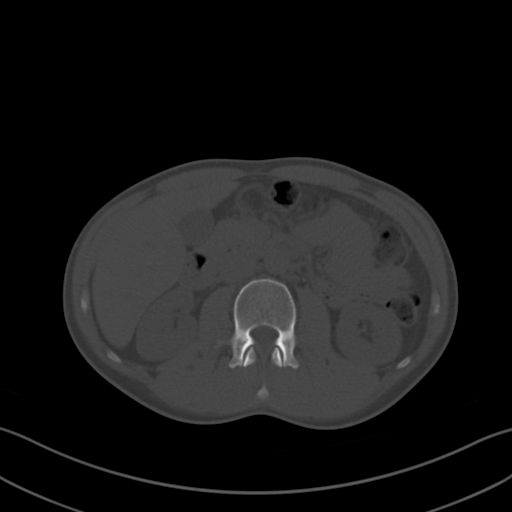
[im 63/90  soft-tissue]
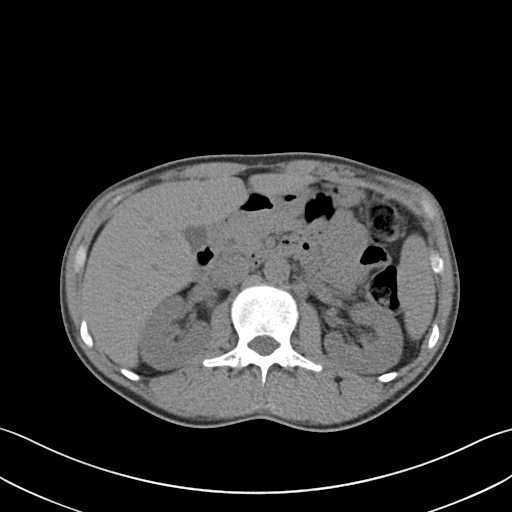
[im 72/90  soft-tissue]
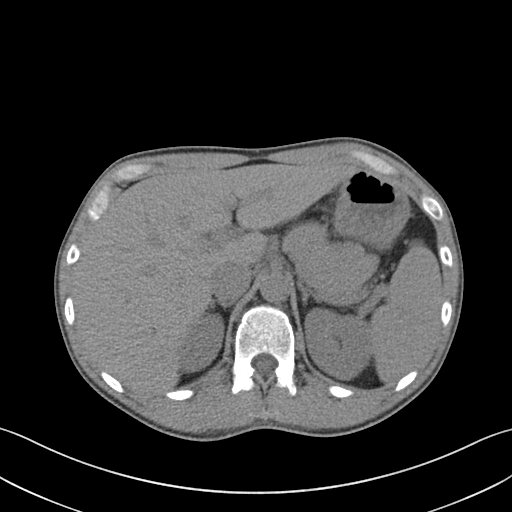
[im 76/90  soft-tissue]
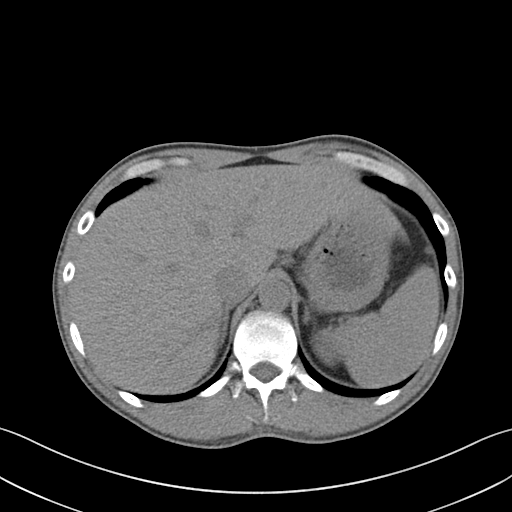
[im 85/90  soft-tissue]
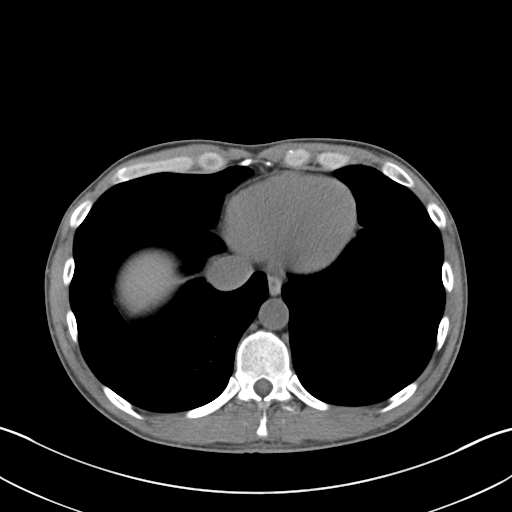

[Series 4: coronal · coronal · 0.65mm/px · 3 of 110 slices shown]
[im 37/110  soft-tissue]
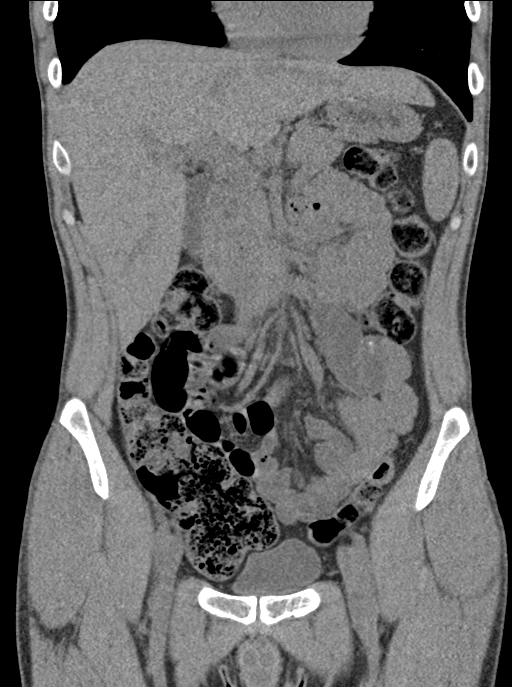
[im 49/110  soft-tissue]
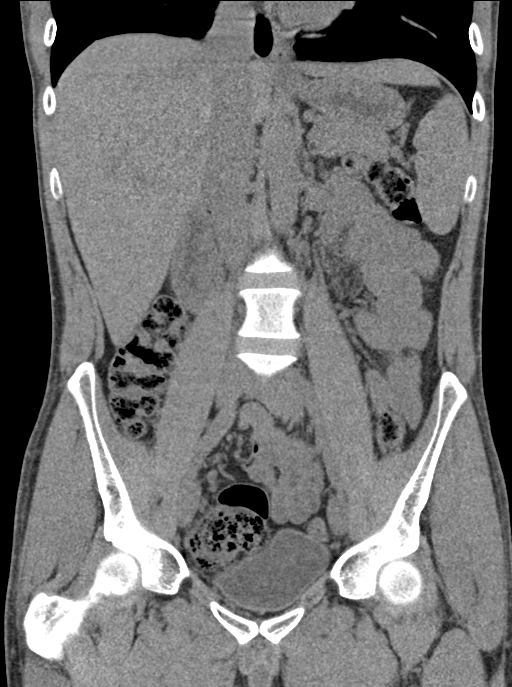
[im 61/110  soft-tissue]
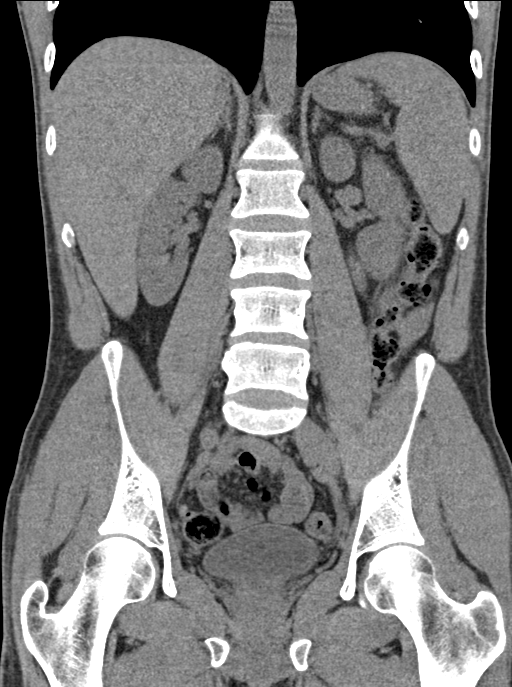

[16 of 46 positions shown; findings below may reference images not displayed]

FINDINGS: Lower chest: Lung bases are clear.

Hepatobiliary: No focal liver abnormality is seen. No gallstones,
gallbladder wall thickening, or biliary dilatation.

Pancreas: Unremarkable. No pancreatic ductal dilatation or
surrounding inflammatory changes.

Spleen: Normal in size without focal abnormality.

Adrenals/Urinary Tract: No adrenal gland nodules. Bilateral
intrarenal stones with several on the right and multiple on the
left. Largest stones measure 4 mm diameter. No hydronephrosis or
hydroureter. No ureteral stones are identified. Bladder is normal.

Stomach/Bowel: Stomach is within normal limits. Appendix appears
normal. No evidence of bowel wall thickening, distention, or
inflammatory changes.

Vascular/Lymphatic: No significant vascular findings are present. No
enlarged abdominal or pelvic lymph nodes.

Reproductive: Prostate is unremarkable.

Other: No abdominal wall hernia or abnormality. No abdominopelvic
ascites.

Musculoskeletal: No acute or significant osseous findings.
IMPRESSION: Bilateral nonobstructing intrarenal stones. No ureteral stone or
obstruction.
